# Patient Record
Sex: Female | Born: 1995 | Race: Asian | Hispanic: No | Marital: Single | Smoking: Never smoker
Health system: Southern US, Community
[De-identification: ages and names within clinical notes are randomized; demographics above are authoritative.]

## PROBLEM LIST (undated history)

## (undated) DIAGNOSIS — K649 Unspecified hemorrhoids: Secondary | ICD-10-CM

## (undated) DIAGNOSIS — Z789 Other specified health status: Secondary | ICD-10-CM

## (undated) HISTORY — PX: NO PAST SURGERIES: SHX2092

---

## 2017-07-28 ENCOUNTER — Other Ambulatory Visit (HOSPITAL_COMMUNITY): Payer: Self-pay | Admitting: Nurse Practitioner

## 2017-07-28 DIAGNOSIS — Z3A13 13 weeks gestation of pregnancy: Secondary | ICD-10-CM

## 2017-07-28 DIAGNOSIS — Z369 Encounter for antenatal screening, unspecified: Secondary | ICD-10-CM

## 2017-08-04 ENCOUNTER — Other Ambulatory Visit (HOSPITAL_COMMUNITY): Payer: Self-pay | Admitting: Nurse Practitioner

## 2017-08-04 ENCOUNTER — Encounter (HOSPITAL_COMMUNITY): Payer: Self-pay

## 2017-08-04 DIAGNOSIS — Z369 Encounter for antenatal screening, unspecified: Secondary | ICD-10-CM

## 2017-08-04 DIAGNOSIS — Z3A13 13 weeks gestation of pregnancy: Secondary | ICD-10-CM

## 2017-08-04 DIAGNOSIS — Z3687 Encounter for antenatal screening for uncertain dates: Secondary | ICD-10-CM

## 2017-08-08 ENCOUNTER — Ambulatory Visit (HOSPITAL_COMMUNITY): Admission: RE | Admit: 2017-08-08 | Payer: Medicaid Other | Source: Ambulatory Visit

## 2017-08-08 ENCOUNTER — Other Ambulatory Visit (HOSPITAL_COMMUNITY): Payer: Self-pay | Admitting: Nurse Practitioner

## 2017-08-08 ENCOUNTER — Ambulatory Visit (HOSPITAL_COMMUNITY)
Admission: RE | Admit: 2017-08-08 | Discharge: 2017-08-08 | Disposition: A | Discharge status: Home / Self Care | Payer: Medicaid Other | Source: Ambulatory Visit | Attending: Nurse Practitioner | Admitting: Nurse Practitioner

## 2017-08-08 ENCOUNTER — Encounter (HOSPITAL_COMMUNITY): Payer: Self-pay

## 2017-08-08 DIAGNOSIS — Z3A09 9 weeks gestation of pregnancy: Secondary | ICD-10-CM

## 2017-08-08 DIAGNOSIS — Z3687 Encounter for antenatal screening for uncertain dates: Secondary | ICD-10-CM

## 2017-08-08 DIAGNOSIS — Z3682 Encounter for antenatal screening for nuchal translucency: Secondary | ICD-10-CM | POA: Insufficient documentation

## 2017-08-08 DIAGNOSIS — Z3A13 13 weeks gestation of pregnancy: Secondary | ICD-10-CM

## 2017-08-08 DIAGNOSIS — Z369 Encounter for antenatal screening, unspecified: Secondary | ICD-10-CM

## 2017-08-08 HISTORY — DX: Other specified health status: Z78.9

## 2017-08-09 ENCOUNTER — Other Ambulatory Visit (HOSPITAL_COMMUNITY): Payer: Self-pay | Admitting: *Deleted

## 2017-08-09 DIAGNOSIS — Z3682 Encounter for antenatal screening for nuchal translucency: Secondary | ICD-10-CM

## 2017-08-29 ENCOUNTER — Other Ambulatory Visit (HOSPITAL_COMMUNITY): Payer: Self-pay

## 2017-08-29 ENCOUNTER — Ambulatory Visit (HOSPITAL_COMMUNITY): Payer: Self-pay

## 2017-08-29 ENCOUNTER — Ambulatory Visit (HOSPITAL_COMMUNITY)
Admission: RE | Admit: 2017-08-29 | Discharge: 2017-08-29 | Disposition: A | Discharge status: Home / Self Care | Payer: Self-pay | Source: Ambulatory Visit | Attending: Nurse Practitioner | Admitting: Nurse Practitioner

## 2017-09-04 ENCOUNTER — Other Ambulatory Visit (HOSPITAL_COMMUNITY): Payer: Self-pay | Admitting: *Deleted

## 2017-09-04 ENCOUNTER — Encounter (HOSPITAL_COMMUNITY): Payer: Self-pay

## 2017-09-04 ENCOUNTER — Other Ambulatory Visit (HOSPITAL_COMMUNITY): Payer: Self-pay | Admitting: Obstetrics and Gynecology

## 2017-09-04 ENCOUNTER — Ambulatory Visit (HOSPITAL_COMMUNITY)
Admission: RE | Admit: 2017-09-04 | Discharge: 2017-09-04 | Disposition: A | Discharge status: Home / Self Care | Payer: Medicaid Other | Source: Ambulatory Visit | Attending: Nurse Practitioner | Admitting: Nurse Practitioner

## 2017-09-04 ENCOUNTER — Ambulatory Visit (HOSPITAL_COMMUNITY): Admission: RE | Admit: 2017-09-04 | Payer: Medicaid Other | Source: Ambulatory Visit

## 2017-09-04 DIAGNOSIS — Z3682 Encounter for antenatal screening for nuchal translucency: Secondary | ICD-10-CM

## 2017-09-04 DIAGNOSIS — Z3A13 13 weeks gestation of pregnancy: Secondary | ICD-10-CM | POA: Diagnosis not present

## 2017-09-04 DIAGNOSIS — Z363 Encounter for antenatal screening for malformations: Secondary | ICD-10-CM | POA: Insufficient documentation

## 2017-10-24 ENCOUNTER — Ambulatory Visit (HOSPITAL_COMMUNITY)
Admission: RE | Admit: 2017-10-24 | Discharge: 2017-10-24 | Disposition: A | Discharge status: Home / Self Care | Payer: Medicaid Other | Source: Ambulatory Visit | Attending: Nurse Practitioner | Admitting: Nurse Practitioner

## 2017-10-24 DIAGNOSIS — Z363 Encounter for antenatal screening for malformations: Secondary | ICD-10-CM | POA: Diagnosis present

## 2017-10-24 DIAGNOSIS — Z3A20 20 weeks gestation of pregnancy: Secondary | ICD-10-CM | POA: Insufficient documentation

## 2018-02-21 NOTE — L&D Delivery Note (Signed)
Delivery Note At 1:12 PM a viable female was delivered via Vaginal, Spontaneous (Presentation: OA). APGAR: 9, 9; weight: pending.   Placenta status: delivered intact, spontaneously.  Cord: 3-vessel with the following complications: None.  Cord pH: N/A  Anesthesia: Epidural Episiotomy: None Lacerations: 2nd degree;Perineal Suture Repair: 2.0 3.0 vicryl Est. Blood Loss (mL): 300cc  Mom to postpartum. Baby to Couplet care / Skin to Skin.  Dollene Cleveland 03/01/2018, 1:59 PM   The patient's contractions increased in strength and frequency.  The patient was AROMed at 12:27 PM and baby was at station -1.  Mom felt the urge to push due to increased pain and pressure in her right hip and thigh.  She pushed for approximately 40 minutes and quickly delivered healthy, viable baby boy.  The patient experienced a second-degree perineal laceration with superficial involvement of the skin closely approaching the anus.  This was repaired with 2.0 Vicryl x2.

## 2018-03-01 ENCOUNTER — Inpatient Hospital Stay (HOSPITAL_COMMUNITY)
Admission: AD | Admit: 2018-03-01 | Discharge: 2018-03-03 | DRG: 807 | Disposition: A | Discharge status: Home / Self Care | Payer: Medicaid Other | Attending: Obstetrics and Gynecology | Admitting: Obstetrics and Gynecology

## 2018-03-01 ENCOUNTER — Inpatient Hospital Stay (HOSPITAL_COMMUNITY): Payer: Medicaid Other | Admitting: Anesthesiology

## 2018-03-01 ENCOUNTER — Encounter (HOSPITAL_COMMUNITY): Payer: Self-pay | Admitting: *Deleted

## 2018-03-01 ENCOUNTER — Other Ambulatory Visit: Payer: Self-pay

## 2018-03-01 DIAGNOSIS — Z3A38 38 weeks gestation of pregnancy: Secondary | ICD-10-CM

## 2018-03-01 DIAGNOSIS — Z789 Other specified health status: Secondary | ICD-10-CM

## 2018-03-01 DIAGNOSIS — Z3483 Encounter for supervision of other normal pregnancy, third trimester: Secondary | ICD-10-CM | POA: Diagnosis present

## 2018-03-01 LAB — TYPE AND SCREEN
ABO/RH(D): A POS
ANTIBODY SCREEN: NEGATIVE

## 2018-03-01 LAB — CBC
HEMATOCRIT: 35 % — AB (ref 36.0–46.0)
Hemoglobin: 11.3 g/dL — ABNORMAL LOW (ref 12.0–15.0)
MCH: 24.1 pg — ABNORMAL LOW (ref 26.0–34.0)
MCHC: 32.3 g/dL (ref 30.0–36.0)
MCV: 74.8 fL — ABNORMAL LOW (ref 80.0–100.0)
Platelets: 273 10*3/uL (ref 150–400)
RBC: 4.68 MIL/uL (ref 3.87–5.11)
RDW: 12.9 % (ref 11.5–15.5)
WBC: 12.9 10*3/uL — AB (ref 4.0–10.5)
nRBC: 0 % (ref 0.0–0.2)

## 2018-03-01 LAB — OB RESULTS CONSOLE ABO/RH: RH TYPE: POSITIVE

## 2018-03-01 LAB — OB RESULTS CONSOLE RUBELLA ANTIBODY, IGM: Rubella: IMMUNE

## 2018-03-01 LAB — OB RESULTS CONSOLE GC/CHLAMYDIA
Chlamydia: NEGATIVE
GC PROBE AMP, GENITAL: NEGATIVE

## 2018-03-01 LAB — OB RESULTS CONSOLE RPR: RPR: NONREACTIVE

## 2018-03-01 LAB — OB RESULTS CONSOLE GBS: STREP GROUP B AG: NEGATIVE

## 2018-03-01 LAB — OB RESULTS CONSOLE ANTIBODY SCREEN: Antibody Screen: NEGATIVE

## 2018-03-01 LAB — OB RESULTS CONSOLE HEPATITIS B SURFACE ANTIGEN: Hepatitis B Surface Ag: NEGATIVE

## 2018-03-01 LAB — ABO/RH: ABO/RH(D): A POS

## 2018-03-01 LAB — OB RESULTS CONSOLE HIV ANTIBODY (ROUTINE TESTING): HIV: NONREACTIVE

## 2018-03-01 MED ORDER — EPHEDRINE 5 MG/ML INJ
10.0000 mg | INTRAVENOUS | Status: DC | PRN
  Filled 2018-03-01: qty 2

## 2018-03-01 MED ORDER — LACTATED RINGERS IV SOLN
INTRAVENOUS | Status: DC
  Administered 2018-03-01 (×3): via INTRAVENOUS

## 2018-03-01 MED ORDER — LACTATED RINGERS IV SOLN: 500.0000 mL | Freq: Once | INTRAVENOUS | Status: DC

## 2018-03-01 MED ORDER — ERYTHROMYCIN 5 MG/GM OP OINT
TOPICAL_OINTMENT | OPHTHALMIC | Status: AC
  Filled 2018-03-01: qty 1

## 2018-03-01 MED ORDER — SIMETHICONE 80 MG PO CHEW: 80.0000 mg | CHEWABLE_TABLET | ORAL | Status: DC | PRN

## 2018-03-01 MED ORDER — DIBUCAINE 1 % RE OINT: 1.0000 "application " | TOPICAL_OINTMENT | RECTAL | Status: DC | PRN

## 2018-03-01 MED ORDER — IBUPROFEN 600 MG PO TABS
600.0000 mg | ORAL_TABLET | Freq: Four times a day (QID) | ORAL | Status: DC
  Administered 2018-03-01 – 2018-03-03 (×8): 600 mg via ORAL
  Filled 2018-03-01 (×8): qty 1

## 2018-03-01 MED ORDER — TETANUS-DIPHTH-ACELL PERTUSSIS 5-2.5-18.5 LF-MCG/0.5 IM SUSP: 0.5000 mL | Freq: Once | INTRAMUSCULAR | Status: DC

## 2018-03-01 MED ORDER — OXYCODONE-ACETAMINOPHEN 5-325 MG PO TABS: 2.0000 | ORAL_TABLET | ORAL | Status: DC | PRN

## 2018-03-01 MED ORDER — COCONUT OIL OIL
1.0000 "application " | TOPICAL_OIL | Status: DC | PRN
  Administered 2018-03-02: 1 via TOPICAL
  Filled 2018-03-01: qty 120

## 2018-03-01 MED ORDER — ONDANSETRON HCL 4 MG/2ML IJ SOLN: 4.0000 mg | Freq: Four times a day (QID) | INTRAMUSCULAR | Status: DC | PRN

## 2018-03-01 MED ORDER — ZOLPIDEM TARTRATE 5 MG PO TABS: 5.0000 mg | ORAL_TABLET | Freq: Every evening | ORAL | Status: DC | PRN

## 2018-03-01 MED ORDER — PHENYLEPHRINE 40 MCG/ML (10ML) SYRINGE FOR IV PUSH (FOR BLOOD PRESSURE SUPPORT)
PREFILLED_SYRINGE | INTRAVENOUS | Status: AC
  Filled 2018-03-01: qty 10

## 2018-03-01 MED ORDER — PHENYLEPHRINE 40 MCG/ML (10ML) SYRINGE FOR IV PUSH (FOR BLOOD PRESSURE SUPPORT)
80.0000 ug | PREFILLED_SYRINGE | INTRAVENOUS | Status: DC | PRN
  Filled 2018-03-01: qty 10

## 2018-03-01 MED ORDER — BENZOCAINE-MENTHOL 20-0.5 % EX AERO
1.0000 "application " | INHALATION_SPRAY | CUTANEOUS | Status: DC | PRN
  Administered 2018-03-01: 1 via TOPICAL
  Filled 2018-03-01: qty 56

## 2018-03-01 MED ORDER — ACETAMINOPHEN 325 MG PO TABS: 650.0000 mg | ORAL_TABLET | ORAL | Status: DC | PRN

## 2018-03-01 MED ORDER — FENTANYL 2.5 MCG/ML BUPIVACAINE 1/10 % EPIDURAL INFUSION (WH - ANES)
14.0000 mL/h | INTRAMUSCULAR | Status: DC | PRN
  Administered 2018-03-01: 14 mL/h via EPIDURAL

## 2018-03-01 MED ORDER — FENTANYL 2.5 MCG/ML BUPIVACAINE 1/10 % EPIDURAL INFUSION (WH - ANES)
INTRAMUSCULAR | Status: AC
  Filled 2018-03-01: qty 100

## 2018-03-01 MED ORDER — SENNOSIDES-DOCUSATE SODIUM 8.6-50 MG PO TABS
2.0000 | ORAL_TABLET | ORAL | Status: DC
  Administered 2018-03-02 (×2): 2 via ORAL
  Filled 2018-03-01 (×2): qty 2

## 2018-03-01 MED ORDER — OXYTOCIN BOLUS FROM INFUSION: 500.0000 mL | Freq: Once | INTRAVENOUS | Status: DC

## 2018-03-01 MED ORDER — ONDANSETRON HCL 4 MG PO TABS: 4.0000 mg | ORAL_TABLET | ORAL | Status: DC | PRN

## 2018-03-01 MED ORDER — LIDOCAINE HCL (PF) 1 % IJ SOLN
INTRAMUSCULAR | Status: DC | PRN
  Administered 2018-03-01: 5 mL via EPIDURAL

## 2018-03-01 MED ORDER — OXYCODONE-ACETAMINOPHEN 5-325 MG PO TABS: 1.0000 | ORAL_TABLET | ORAL | Status: DC | PRN

## 2018-03-01 MED ORDER — ONDANSETRON HCL 4 MG/2ML IJ SOLN: 4.0000 mg | INTRAMUSCULAR | Status: DC | PRN

## 2018-03-01 MED ORDER — PRENATAL MULTIVITAMIN CH
1.0000 | ORAL_TABLET | Freq: Every day | ORAL | Status: DC
  Administered 2018-03-02 – 2018-03-03 (×2): 1 via ORAL
  Filled 2018-03-01 (×2): qty 1

## 2018-03-01 MED ORDER — WITCH HAZEL-GLYCERIN EX PADS: 1.0000 "application " | MEDICATED_PAD | CUTANEOUS | Status: DC | PRN

## 2018-03-01 MED ORDER — LACTATED RINGERS IV SOLN: 500.0000 mL | INTRAVENOUS | Status: DC | PRN

## 2018-03-01 MED ORDER — DIPHENHYDRAMINE HCL 50 MG/ML IJ SOLN: 12.5000 mg | INTRAMUSCULAR | Status: DC | PRN

## 2018-03-01 MED ORDER — DIPHENHYDRAMINE HCL 25 MG PO CAPS: 25.0000 mg | ORAL_CAPSULE | Freq: Four times a day (QID) | ORAL | Status: DC | PRN

## 2018-03-01 MED ORDER — OXYTOCIN 40 UNITS IN NORMAL SALINE INFUSION - SIMPLE MED
2.5000 [IU]/h | INTRAVENOUS | Status: DC
  Administered 2018-03-01: 500 [IU]/h via INTRAVENOUS
  Filled 2018-03-01: qty 1000

## 2018-03-01 MED ORDER — SOD CITRATE-CITRIC ACID 500-334 MG/5ML PO SOLN: 30.0000 mL | ORAL | Status: DC | PRN

## 2018-03-01 MED ORDER — LIDOCAINE HCL (PF) 1 % IJ SOLN
30.0000 mL | INTRAMUSCULAR | Status: DC | PRN
  Administered 2018-03-01: 30 mL via SUBCUTANEOUS
  Filled 2018-03-01: qty 30

## 2018-03-01 NOTE — Anesthesia Procedure Notes (Signed)
Epidural Patient location during procedure: OB Start time: 03/01/2018 10:34 AM End time: 03/01/2018 10:40 AM  Staffing Anesthesiologist: Shelton Silvas, MD Performed: anesthesiologist   Preanesthetic Checklist Completed: patient identified, site marked, surgical consent, pre-op evaluation, timeout performed, IV checked, risks and benefits discussed and monitors and equipment checked  Epidural Patient position: sitting Prep: ChloraPrep Patient monitoring: heart rate, continuous pulse ox and blood pressure Approach: midline Location: L3-L4 Injection technique: LOR saline  Needle:  Needle type: Tuohy  Needle gauge: 17 G Needle length: 9 cm Catheter type: closed end flexible Catheter size: 20 Guage Test dose: negative and 1.5% lidocaine  Assessment Events: blood not aspirated, injection not painful, no injection resistance and no paresthesia  Additional Notes LOR @ 5  Patient identified. Risks/Benefits/Options discussed with patient including but not limited to bleeding, infection, nerve damage, paralysis, failed block, incomplete pain control, headache, blood pressure changes, nausea, vomiting, reactions to medications, itching and postpartum back pain. Confirmed with bedside nurse the patient's most recent platelet count. Confirmed with patient that they are not currently taking any anticoagulation, have any bleeding history or any family history of bleeding disorders. Patient expressed understanding and wished to proceed. All questions were answered. Sterile technique was used throughout the entire procedure. Please see nursing notes for vital signs. Test dose was given through epidural catheter and negative prior to continuing to dose epidural or start infusion. Warning signs of high block given to the patient including shortness of breath, tingling/numbness in hands, complete motor block, or any concerning symptoms with instructions to call for help. Patient was given instructions on  fall risk and not to get out of bed. All questions and concerns addressed with instructions to call with any issues or inadequate analgesia.    Reason for block:procedure for pain

## 2018-03-01 NOTE — Progress Notes (Signed)
1020 Interpreter arrived from [employer].

## 2018-03-01 NOTE — H&P (Signed)
Crystal Griffith is a 23 y.o. female presenting for Labor.  [name] live interpretor present in room. Patient came in advance labor, contracting every 2 minutes.   OB History    Gravida  1   Para      Term      Preterm      AB      Living  0     SAB      TAB      Ectopic      Multiple      Live Births  0          Past Medical History:  Diagnosis Date  . Medical history non-contributory    Past Surgical History:  Procedure Laterality Date  . NO PAST SURGERIES     Family History: family history is not on file. Social History:  reports that she has never smoked. She has never used smokeless tobacco. She reports that she does not drink alcohol or use drugs.     Maternal Diabetes: No Genetic Screening: Normal Maternal Ultrasounds/Referrals: Normal Fetal Ultrasounds or other Referrals:  None Maternal Substance Abuse:  No Significant Maternal Medications:  None Significant Maternal Lab Results:  Lab values include: Group B Strep negative, Other:  Other Comments:  HbE trait  ROS History Dilation: 9 Effacement (%): 100 Station: -1 Exam by:: [place], rn Blood pressure 130/64, pulse 76, temperature 98.5 F (36.9 C), temperature source Oral, resp. rate 16, height 5\' 1"  (1.549 m), weight 74.4 kg, last menstrual period 05/25/2017, SpO2 99 %. Exam Physical Exam  Prenatal labs: ABO, Rh: --/--/A POS (01/09 16100914) Antibody: NEG (01/09 0914) Rubella: Immune (01/09 0000) RPR: Nonreactive (01/09 0000)  HBsAg: Negative (01/09 0000)  HIV: Non-reactive (01/09 0000)  GBS: Negative (01/09 0000)   FWB: 135 bpm / mod var / +accels, no decels, REACTIVE CTX: q162min  Assessment/Plan: 23 y.o. G1P0 at 545w3d here for advanced labor.   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   Jen MowElizabeth Talene Glastetter, DO OB Fellow, [facility]  P[facility] Center for [facility], [facility] 03/01/2018, 10:52 AM

## 2018-03-01 NOTE — Anesthesia Pain Management Evaluation Note (Signed)
  CRNA Pain Management Visit Note  Patient: Crystal Griffith, 23 y.o., female  "Hello I am a member of the anesthesia team at [facility]. We have an anesthesia team available at all times to provide care throughout the hospital, including epidural management and anesthesia for C-section. I don't know your plan for the delivery whether it a natural birth, water birth, IV sedation, nitrous supplementation, doula or epidural, but we want to meet your pain goals."   1.Was your pain managed to your expectations on prior hospitalizations?   Yes   2.What is your expectation for pain management during this hospitalization?     Epidural  3.How can we help you reach that goal? epidural  Record the patient's initial score and the patient's pain goal.   Pain: 0  Pain Goal: 4 [facility] wants you to be able to say your pain was always managed very well.  Starling Christofferson 03/01/2018

## 2018-03-01 NOTE — MAU Note (Signed)
Pt reports contractions , some spotting, and decreased fetal movement. Pt denies ROM

## 2018-03-01 NOTE — Anesthesia Preprocedure Evaluation (Signed)
Anesthesia Evaluation  Patient identified by MRN, date of birth, ID band Patient awake    Reviewed: Allergy & Precautions, Patient's Chart, lab work & pertinent test results  Airway Mallampati: II       Dental no notable dental hx.    Pulmonary    Pulmonary exam normal        Cardiovascular negative cardio ROS Normal cardiovascular exam     Neuro/Psych negative neurological ROS     GI/Hepatic Neg liver ROS,   Endo/Other    Renal/GU negative Renal ROS     Musculoskeletal   Abdominal   Peds  Hematology   Anesthesia Other Findings   Reproductive/Obstetrics (+) Pregnancy                             Anesthesia Physical Anesthesia Plan  ASA: II  Anesthesia Plan: Epidural   Post-op Pain Management:    Induction:   PONV Risk Score and Plan:   Airway Management Planned: Natural Airway  Additional Equipment: None  Intra-op Plan:   Post-operative Plan:   Informed Consent: I have reviewed the patients History and Physical, chart, labs and discussed the procedure including the risks, benefits and alternatives for the proposed anesthesia with the patient or authorized representative who has indicated his/her understanding and acceptance.     Plan Discussed with:   Anesthesia Plan Comments: (Interpreter  Lab Results      Component                Value               Date                      WBC                      12.9 (H)            03/01/2018                HGB                      11.3 (L)            03/01/2018                HCT                      35.0 (L)            03/01/2018                MCV                      74.8 (L)            03/01/2018                PLT                      273                 03/01/2018           )        Anesthesia Quick Evaluation

## 2018-03-02 DIAGNOSIS — Z789 Other specified health status: Secondary | ICD-10-CM

## 2018-03-02 LAB — CBC
HCT: 31.7 % — ABNORMAL LOW (ref 36.0–46.0)
Hemoglobin: 10.2 g/dL — ABNORMAL LOW (ref 12.0–15.0)
MCH: 23.9 pg — ABNORMAL LOW (ref 26.0–34.0)
MCHC: 32.2 g/dL (ref 30.0–36.0)
MCV: 74.2 fL — ABNORMAL LOW (ref 80.0–100.0)
PLATELETS: 250 10*3/uL (ref 150–400)
RBC: 4.27 MIL/uL (ref 3.87–5.11)
RDW: 12.9 % (ref 11.5–15.5)
WBC: 16.3 10*3/uL — ABNORMAL HIGH (ref 4.0–10.5)
nRBC: 0 % (ref 0.0–0.2)

## 2018-03-02 LAB — RPR: RPR Ser Ql: NONREACTIVE

## 2018-03-02 NOTE — Anesthesia Postprocedure Evaluation (Signed)
Anesthesia Post Note  Patient: Crystal Griffith  Procedure(s) Performed: AN AD HOC LABOR EPIDURAL     Patient location during evaluation: Mother Baby Anesthesia Type: Epidural Level of consciousness: awake and alert, oriented and patient cooperative Pain management: pain level controlled Vital Signs Assessment: post-procedure vital signs reviewed and stable Respiratory status: spontaneous breathing Cardiovascular status: stable Postop Assessment: no headache, epidural receding, patient able to bend at knees and no signs of nausea or vomiting Anesthetic complications: no Comments: Pain score 2.     Last Vitals:  Vitals:   03/02/18 0015 03/02/18 0525  BP: 114/70 (!) 112/52  Pulse: 76 79  Resp: 18 18  Temp: 37.1 C 37 C  SpO2:      Last Pain:  Vitals:   03/02/18 0525  TempSrc: Oral  PainSc:    Pain Goal:                 [facility]
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
## 2018-03-02 NOTE — Lactation Note (Signed)
This note was copied from a baby's chart. Lactation Consultation Note  Patient Name: Crystal Griffith Date: 03/02/2018 Reason for consult: Initial assessment    [place] interpreter used via phone.  LC offered to assist with bf.  Mom concerned with not having any milk.  Infant undressed and placed STS with mom.  Hand expression taught.  No drops seen.    Mom has wider spaced and slightly tubular breast with short shafted nipples but easily compressible breast tissue.  LC encouraged using breast support with one hand when latching.  LC encourages mom not to nurse in cradle in order to give more neck/head support for infant.  Pillows used to support infant and latch achieved in football hold.  Good rhythmic sucking noted and a swallows were heard with a few compressions.  Infant nursed until falling asleep then bf on the other side until self detaching and falling asleep.    LC reviewed bf basics, bf at least 8-12 times in 24 hours, cueing and feeding on demand, and STS.    Mom desires to bottle feed with formula some in the future as well but told LC she would prefer to only nurse right now due to the successful latching and nursing this feed.  LC reviewed supply and demand and colostrum amounts and stomach size.    Information relayed to RN that mom desires to breast and bottle feed.  Lactation brochure and OP services shared.  Maternal Data Has patient been taught Hand Expression?: Yes Does the patient have breastfeeding experience prior to this delivery?: No  Feeding Feeding Type: Breast Fed  LATCH Score Latch: Repeated attempts needed to sustain latch, nipple held in mouth throughout feeding, stimulation needed to elicit sucking reflex.  Audible Swallowing: A few with stimulation  Type of Nipple: Everted at rest and after stimulation(shorter shaft but compressible)  Comfort (Breast/Nipple): Soft / non-tender  Hold (Positioning): Assistance needed to correctly position  infant at breast and maintain latch.  LATCH Score: 7  Interventions Interventions: Breast feeding basics reviewed;Assisted with latch  Lactation Tools Discussed/Used     Consult Status Consult Status: Follow-up Date: 03/02/18 Follow-up type: In-patient    Maryruth Hancock [facility] 03/02/2018, 12:06 AM

## 2018-03-02 NOTE — Progress Notes (Addendum)
Daily Post Partum Note  03/02/2018 Crystal Griffith is a 23 y.o. G1P1001 PPD#1 s/p  SVD/2nd degree  @ [redacted]w[redacted]d.  Pregnancy c/b nothing 24hr/overnight events:  none  Subjective:  Pt doing well and meeting all PP goals.   Objective:    Current Vital Signs 24h Vital Sign Ranges  T 99 F (37.2 C) Temp  Avg: 98.9 F (37.2 C)  Min: 98.6 F (37 C)  Max: 99.6 F (37.6 C)  BP (!) 110/59 BP  Min: 110/59  Max: 124/66  HR 63 Pulse  Avg: 73.8  Min: 63  Max: 79  RR 16 Resp  Avg: 17.3  Min: 16  Max: 18  SaO2 100 %   SpO2  Avg: 100 %  Min: 100 %  Max: 100 %       24 Hour I/O Current Shift I/O  Time Ins Outs 01/09 0701 - 01/10 0700 In: -  Out: 400 [Urine:100] No intake/output data recorded.    General: NAD Abdomen: soft, nttp. Firm fundus below the umbilicus. Incision Perineum: deferred Skin:  Warm and dry.  Cardiovascular: S1, S2 normal, no murmur, rub or gallop, regular rate and rhythm Respiratory:  Clear to auscultation bilateral. Normal respiratory effort Extremities: no c/c/e  Medications Current Facility-Administered Medications  Medication Dose Route Frequency Provider Last Rate Last Dose  . acetaminophen (TYLENOL) tablet 650 mg  650 mg Oral Q4H PRN Peggyann Shoals C, DO      . benzocaine-Menthol (DERMOPLAST) 20-0.5 % topical spray 1 application  1 application Topical PRN Peggyann Shoals C, DO   1 application at 03/01/18 1630  . coconut oil  1 application Topical PRN Dollene Cleveland, DO      . witch hazel-glycerin (TUCKS) pad 1 application  1 application Topical PRN Dollene Cleveland, DO       And  . dibucaine (NUPERCAINAL) 1 % rectal ointment 1 application  1 application Rectal PRN Dollene Cleveland, DO      . diphenhydrAMINE (BENADRYL) capsule 25 mg  25 mg Oral Q6H PRN Dollene Cleveland, DO      . ibuprofen (ADVIL,MOTRIN) tablet 600 mg  600 mg Oral Q6H Anderson, Hannah C, DO   600 mg at 03/02/18 1230  . ondansetron (ZOFRAN) tablet 4 mg  4 mg Oral Q4H PRN Peggyann Shoals C, DO        Or  . ondansetron [facility]) injection 4 mg  4 mg Intravenous Q4H PRN Dollene Cleveland, DO      . prenatal multivitamin tablet 1 tablet  1 tablet Oral Q1200 Peggyann Shoals C, DO   1 tablet at 03/02/18 1230  . senna-docusate (Senokot-S) tablet 2 tablet  2 tablet Oral Q24H Peggyann Shoals C, DO   2 tablet at 03/02/18 0016  . simethicone (MYLICON) chewable tablet 80 mg  80 mg Oral PRN Dollene Cleveland, DO      . Tdap (BOOSTRIX) injection 0.5 mL  0.5 mL Intramuscular Once Peggyann Shoals C, DO      . zolpidem (AMBIEN) tablet 5 mg  5 mg Oral QHS PRN Dollene Cleveland, DO        Labs:  Recent Labs  Lab 03/01/18 0914 03/02/18 0600  WBC 12.9* 16.3*  HGB 11.3* 10.2*  HCT 35.0* 31.7*  PLT 273 250    Assessment & Plan:  Pt doing well *Postpartum/postop: routine care *Dispo: tomorrow  Interpreter used  A POS / Rubella Immune / Varicella Not immune/  RPR negative / HIV negative /  HepBsAg negative / pap neg 2018 / Breast  / Contraception: patient unsure / Circ: no/ Follow up: [facility]  Cornelia Copa MD Attending Center for [facility])

## 2018-03-03 ENCOUNTER — Encounter (HOSPITAL_COMMUNITY): Payer: Self-pay | Admitting: *Deleted

## 2018-03-03 NOTE — Progress Notes (Signed)
Reviewed d/c instructions with the patient via interpreter # [phone].  Also informed her that baby would remain as a patient and become a baby pt.

## 2018-03-03 NOTE — Lactation Note (Signed)
This note was copied from a baby's chart. Lactation Consultation Note  Patient Name: Crystal Griffith Date: 03/03/2018 Reason for consult: Follow-up assessment  [name] interpreter [phone] used. P1, Baby 44 hours old. Baby cueing. Encouraged mother to unwrap baby for feeding. Mother latched baby in cradle hold.  Turned infant's body toward mother. Intermittent sucks and swallows observed. Feed on demand approximately 8-12 times per day.   Reviewed engorgement care and monitoring voids/stools. Mother had manual pump.  Denies questions or concerns.   Maternal Data    Feeding Feeding Type: Breast Fed  LATCH Score Latch: Grasps breast easily, tongue down, lips flanged, rhythmical sucking.  Audible Swallowing: A few with stimulation  Type of Nipple: Everted at rest and after stimulation  Comfort (Breast/Nipple): Soft / non-tender  Hold (Positioning): No assistance needed to correctly position infant at breast.  LATCH Score: 9  Interventions Interventions: Breast feeding basics reviewed;Hand pump  Lactation Tools Discussed/Used     Consult Status Consult Status: Complete Date: 03/03/18    Dahlia Byes [facility] 03/03/2018, 10:02 AM

## 2018-03-03 NOTE — Progress Notes (Signed)
CSW spoke with MOB's bedside nurse and confirmed that MOB's misinterpreted question #10 on the Edinburgh assessment. MOB has had thoughts in the past but none current or within the last 7 days.    MOB also has all essential  Items for infant as observed by CSW (car seat) and bedside nurse (baby box).  CSW received consult for hx of Anxiety and Depression.  CSW met with MOB to offer support and complete assessment.    CSW identifies no further need for intervention and no barriers to discharge at this time.  Laurey Arrow, MSW, LCSW Clinical Social Work [phone]

## 2018-03-03 NOTE — Discharge Summary (Addendum)
OB Discharge Summary     Patient Name: Crystal Griffith DOB: 1996/02/02 MRN: [id number]  Date of admission: 03/01/2018 Delivering MD: Peggyann Shoals C   Date of discharge: 03/03/2018  Admitting diagnosis: 38WKS CONTRACTIONS Intrauterine pregnancy: [redacted]w[redacted]d     Secondary diagnosis:  Active Problems:   Normal delivery at term   Language barrier  Additional problems: None     Discharge diagnosis: Term Pregnancy Delivered                                                                                                Post partum procedures:2nd Lac repair with suli  Augmentation: Pitocin  Complications: None  Hospital course:  Onset of Labor With Vaginal Delivery     23 y.o. yo G1P0 at [redacted]w[redacted]d was admitted in Latent Labor on 03/01/2018. Patient had an uncomplicated labor course as follows:  Membrane Rupture Time/Date: 12:27 PM ,03/01/2018   Intrapartum Procedures: Episiotomy: None [1]                                         Lacerations:  2nd degree [3];Perineal [11]  Patient had a delivery of a Viable infant. 03/01/2018  Information for the patient's newborn:  Tiare, Deegan [[id number]]  Delivery Method: Vag-Spont    Pateint had an uncomplicated postpartum course.  She is ambulating, tolerating a regular diet, passing flatus, and urinating well. Patient is discharged home in stable condition on 03/03/18.   Physical exam  Vitals:   03/02/18 1523 03/02/18 1603 03/02/18 2326 03/03/18 0611  BP: (!) 125/54  111/72 (!) 92/50  Pulse: 61  70 (!) 54  Resp: 18  12 16   Temp: 98.6 F (37 C) 98.4 F (36.9 C) 98.3 F (36.8 C) 98.4 F (36.9 C)  TempSrc: Oral Oral Oral Oral  SpO2:   100%   Weight:      Height:       General: alert, cooperative and no distress Lochia: appropriate Uterine Fundus: firm Incision: Healing well with no significant drainage DVT Evaluation: No evidence of DVT seen on physical exam. Negative Homan's sign. No cords or calf tenderness. No significant calf/ankle  edema. Labs: Lab Results  Component Value Date   WBC 16.3 (H) 03/02/2018   HGB 10.2 (L) 03/02/2018   HCT 31.7 (L) 03/02/2018   MCV 74.2 (L) 03/02/2018   PLT 250 03/02/2018   No flowsheet data found.  Discharge instruction: per After Visit Summary and "Baby and Me Booklet".  After visit meds:  Allergies as of 03/03/2018   No Known Allergies     Medication List    TAKE these medications   PRENATAL VITAMIN PO Take 1 tablet by mouth daily.       Diet: routine diet  Activity: Advance as tolerated. Pelvic rest for 6 weeks.   Outpatient follow up:4 weeks Follow up Appt:No future appointments. Follow up Visit:No follow-ups on file.  Postpartum contraception: Undecided  Newborn Data: Live born female  Birth Weight: 7 lb 1.8 oz (3225 g)  APGAR: 9,   Newborn Delivery   Birth date/time:  03/01/2018 13:12:00 Delivery type:  Vaginal, Spontaneous     Baby Feeding: Breast Disposition:home with mother   03/03/2018 Dollene Cleveland, DO   OB FELLOW DISCHARGE ATTESTATION  I have seen and examined this patient and agree with above documentation in the resident's note.   Marcy Siren, D.O. OB Fellow  03/05/2018, 2:36 PM

## 2018-03-04 ENCOUNTER — Ambulatory Visit: Payer: Self-pay

## 2018-03-04 NOTE — Lactation Note (Signed)
This note was copied from a baby's chart. Lactation Consultation Note  Patient Name: Crystal Griffith [id number] Date: 03/04/2018 Reason for consult: Follow-up assessment;Early term 37-38.6wks;Primapara;1st time breastfeeding  P1 mother whose infant is now 38+3 weeks.  Baby was laying next to mother in the side lying position when I arrived.  He was sleeping.  Mother stated that her nipples were bleeding and painful but she felt fine breast feeding baby.  We discussed latching and proper technique to obtain a deep latch.  Upon assessment, mother's breasts were soft and non tender and nipples were intact.  Her left nipple was slightly reddened but right nipple looked intact without breakdown.  Suggested she use EBM to rub into nipples/areolas and then I also provided comfort gels with instructions for use.  Applied them and mother felt immediate relief.  Engorgement prevention/treatment reviewed.  She will continue to feed 8-12 times/24 hours or sooner if baby shows feeding cues.  Stressed the importance of a wide gape prior to latching and to obtain a deep latch into the breast tissue.  I offered to assist if mother wanted to stay for another feeding prior to discharge.  However, mother decided that she would rather be discharged and practice at home.  She has our OP phone number for questions/concerns.  Father present.     Maternal Data Formula Feeding for Exclusion: No Has patient been taught Hand Expression?: Yes Does the patient have breastfeeding experience prior to this delivery?: No  Feeding    LATCH Score                   Interventions    Lactation Tools Discussed/Used WIC Program: Yes   Consult Status Consult Status: Complete Date: 03/04/18 Follow-up type: Call as needed    Britanny Marksberry [place] 03/04/2018, 12:05 PM

## 2018-07-28 IMAGING — US US MFM OB COMPLETE +14 WKS
1 series · 15 of 27 positions shown · non-contrast
Comparison: none

[Series 1: us mfm ob complete +14 wks · 15 of 27 slices shown]
[im 1/27]
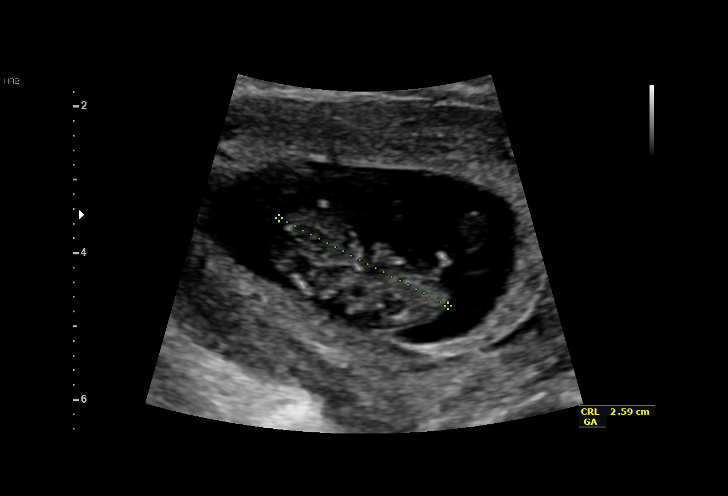
[im 3/27]
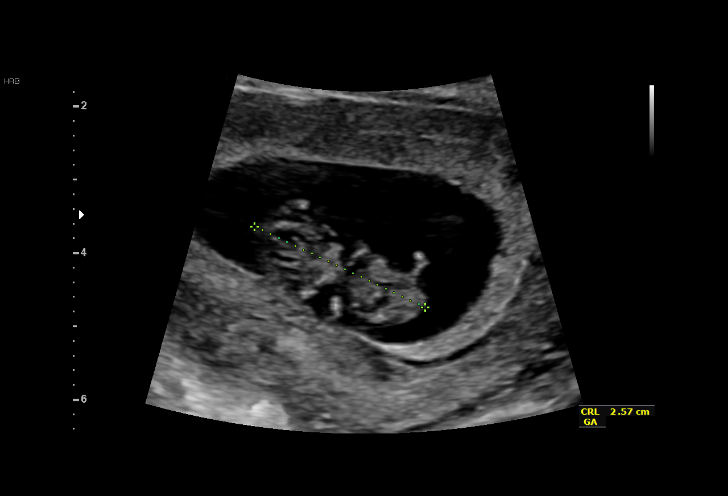
[im 5/27]
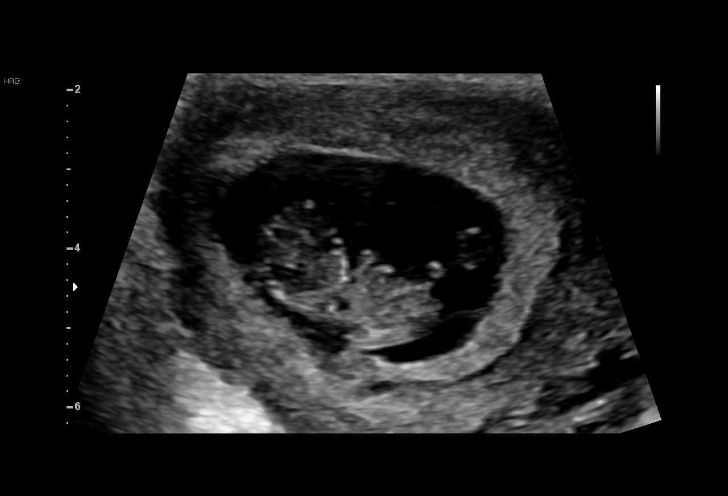
[im 7/27]
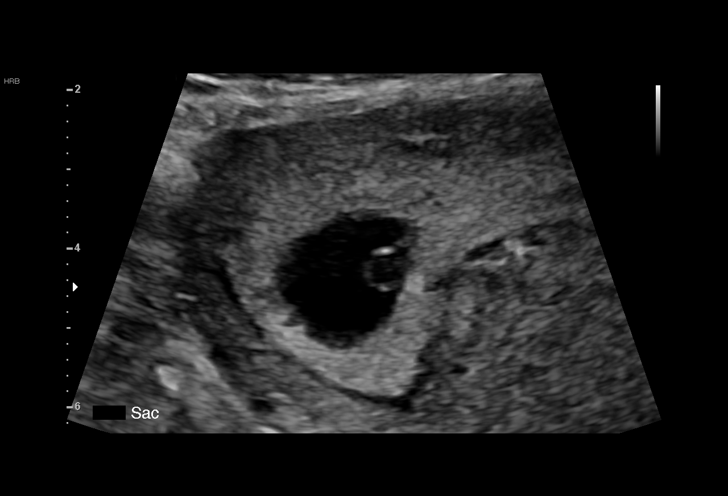
[im 9/27]
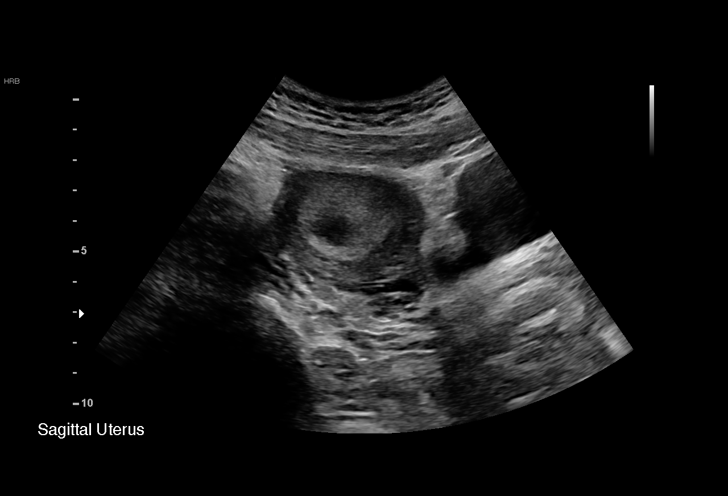
[im 10/27]
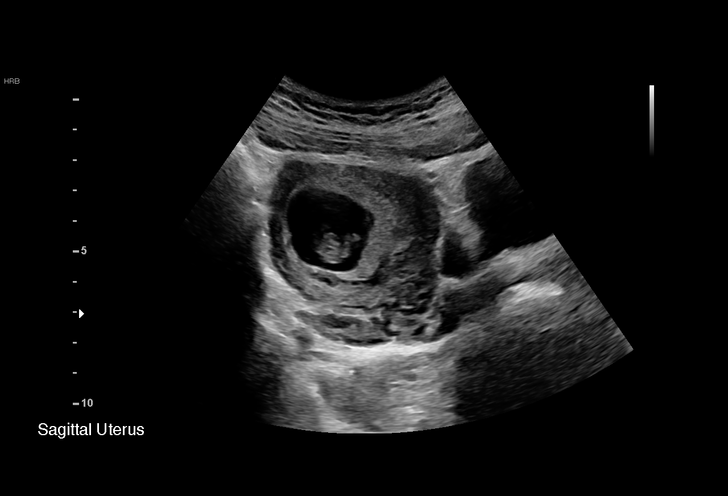
[im 12/27]
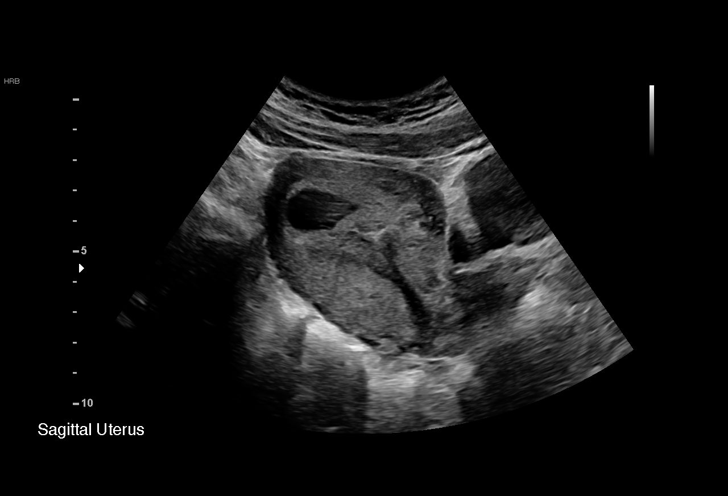
[im 14/27]
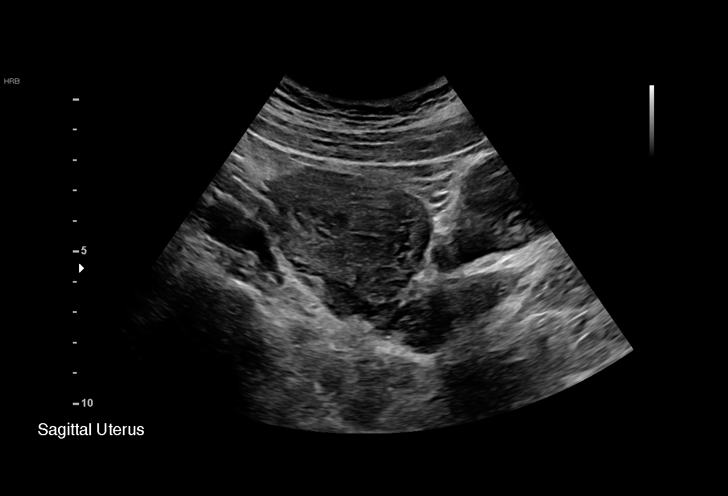
[im 16/27]
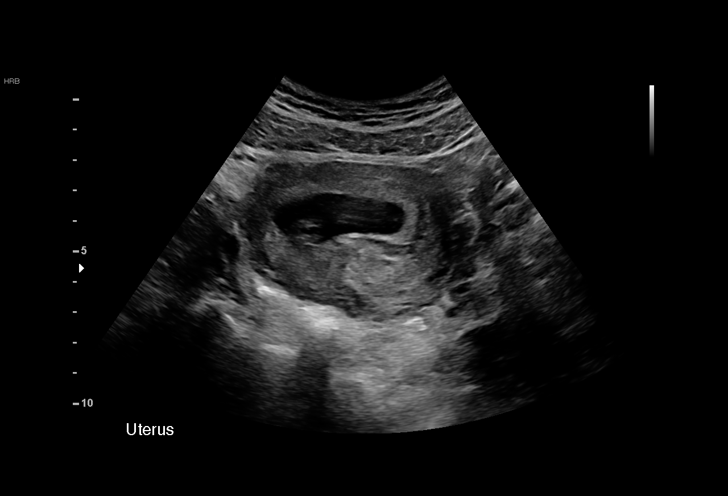
[im 18/27]
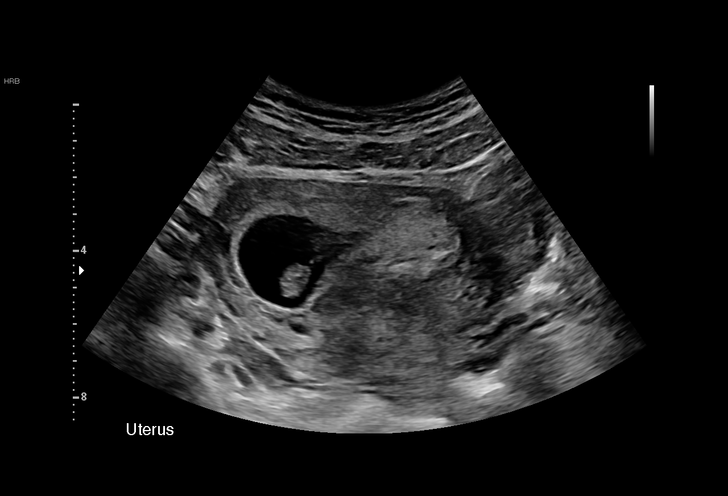
[im 19/27]
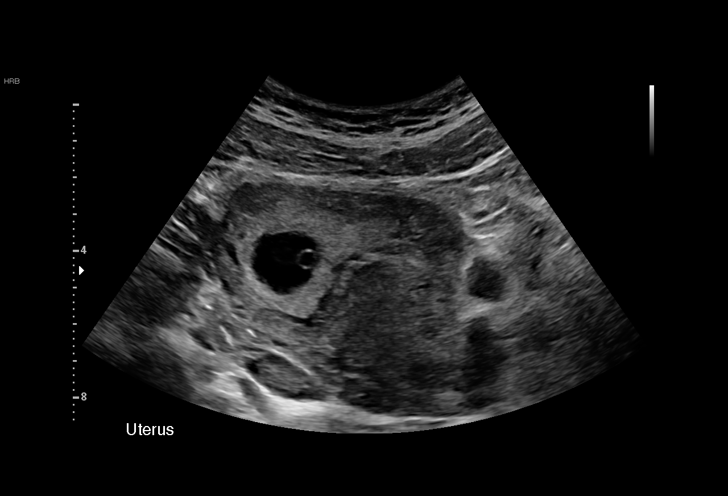
[im 21/27]
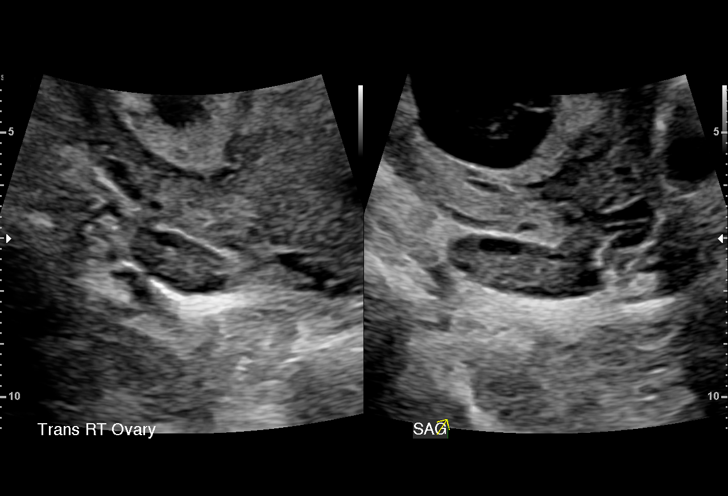
[im 23/27]
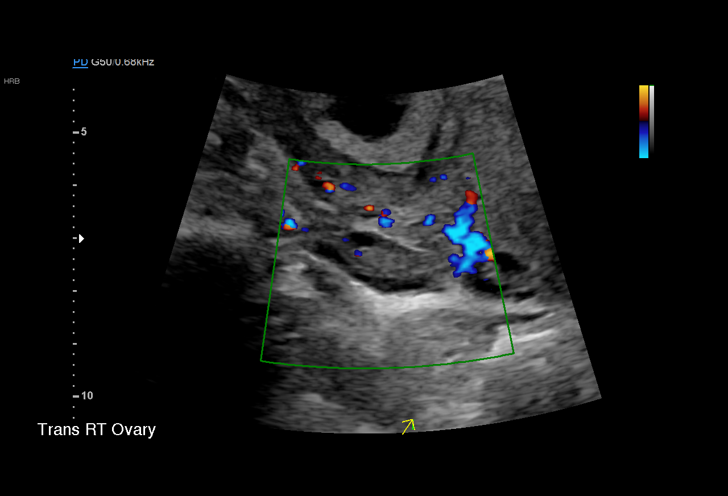
[im 25/27]
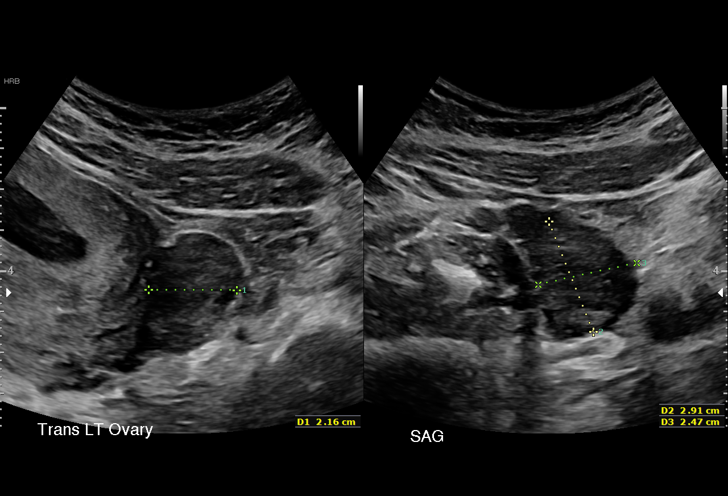
[im 27/27]
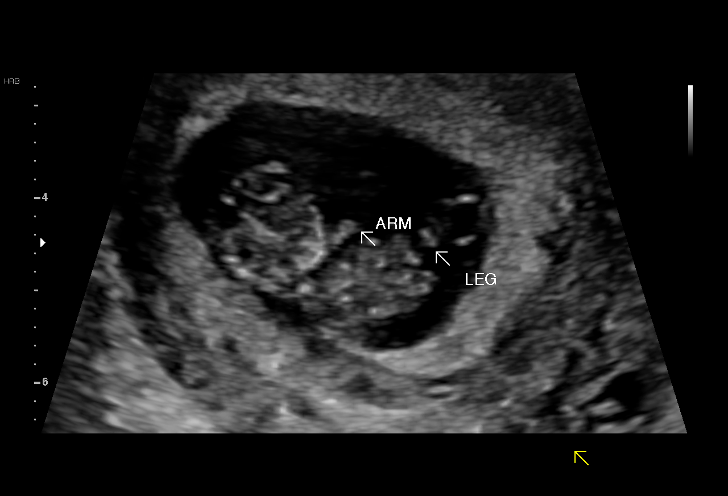

[15 of 27 positions shown; findings below may reference images not displayed]

Health
KABITA NP

WEEKS

1  BLAIN JUMPER             [id number]      [id number]     [id number]
Indications

9 weeks gestation of pregnancy
Encounter for nuchal translucency
OB History

Blood Type:            Height:  5'1"   Weight (lb):  141       BMI:
Gravidity:    1
Fetal Evaluation

Num Of Fetuses:     1
Preg. Location:     Intrauterine
Gest. Sac:          Intrauterine
Yolk Sac:           Visualized
Fetal Pole:         Visualized
Fetal Heart         187
Rate(bpm):
Cardiac Activity:   Observed

Amniotic Fluid
AFI FV:      Subjectively within normal limits
Biometry

CRL:      25.5  mm     G. Age:  9w 1d                   EDD:   03/12/18
Gestational Age

LMP:           13w 5d        Date:  05/04/17                 EDD:   02/08/18
Best:          9w 1d      Det. By:  U/S C R L (08/08/17)     EDD:   03/12/18
Cervix Uterus Adnexa
Cervix
Normal appearance by transabdominal scan.

Uterus
No abnormality visualized.

Left Ovary
Size(cm)     2.91   x   2.47   x  2.16      Vol(ml):
Within normal limits. Small corpus luteum noted.

Right Ovary
Size(cm)     3.07   x   1.01   x  1.8       Vol(ml):
Within normal limits.

Cul De Sac:   No free fluid seen.

Adnexa:       No abnormality visualized.
Impression

IUP at 13+5 weeks here for first trimester screening
Normal fetal cardiac activity
Normal fetal morphology; nasal bone not visualized
CRL indicates EGA of 9+1 weeks
Nuchal translucency not measured today
Recommendations

Recommend using EDC of 03/12/2018
Repeat attempt at NT scan in 3 weeks

## 2018-10-13 IMAGING — US US MFM OB COMP +14 WKS
1 series · 14 of 28 positions shown · non-contrast
Comparison: none

[Series 1: us mfm ob comp +14 wks · 14 of 128 slices shown]
[im 5/128]
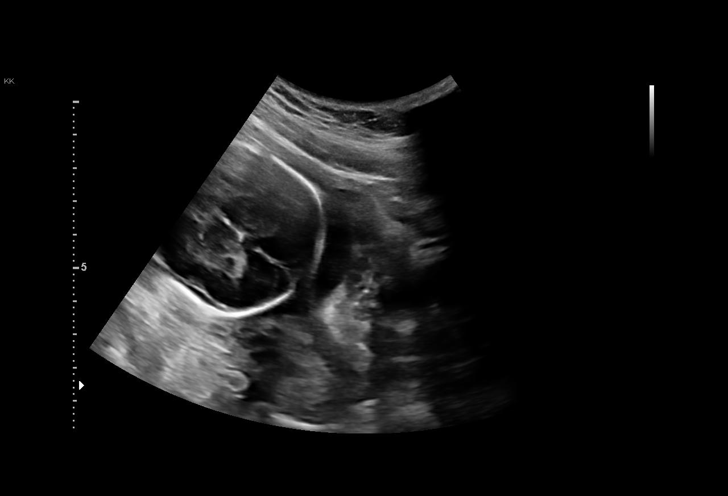
[im 15/128]
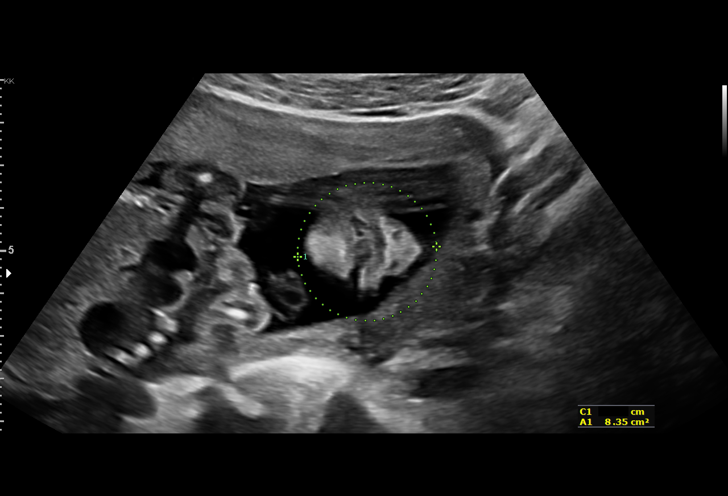
[im 24/128]
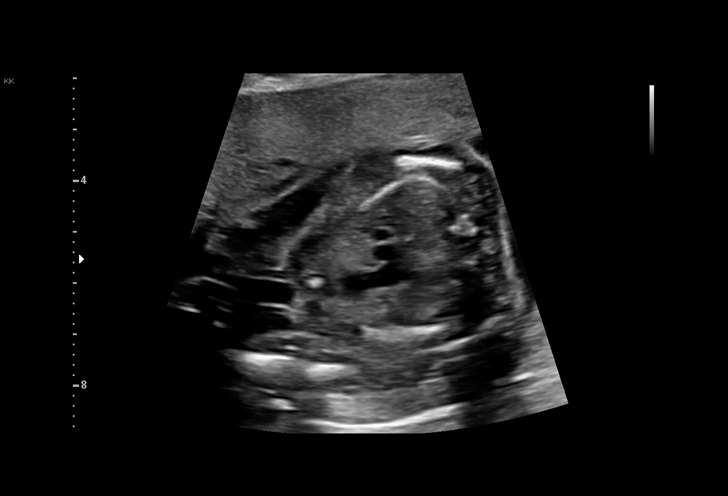
[im 33/128]
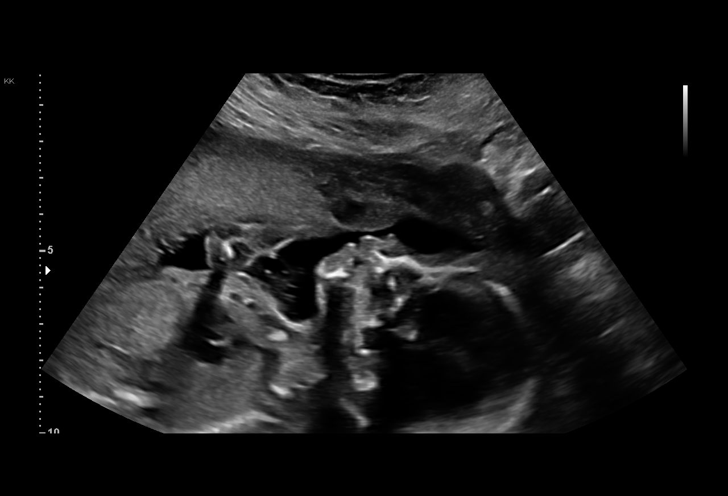
[im 43/128]
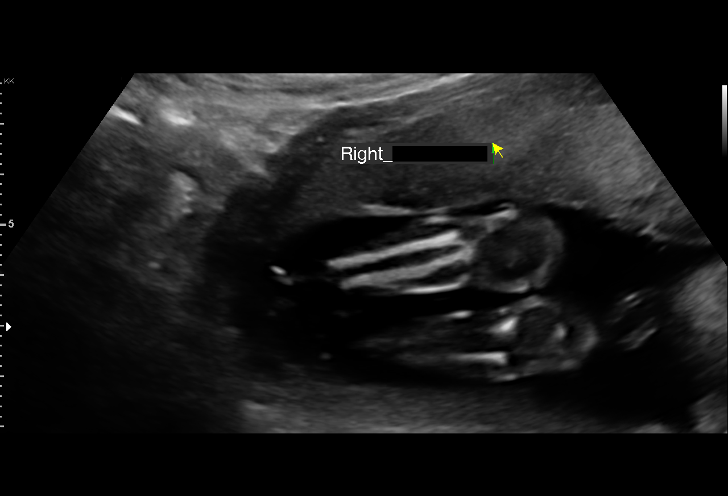
[im 52/128]
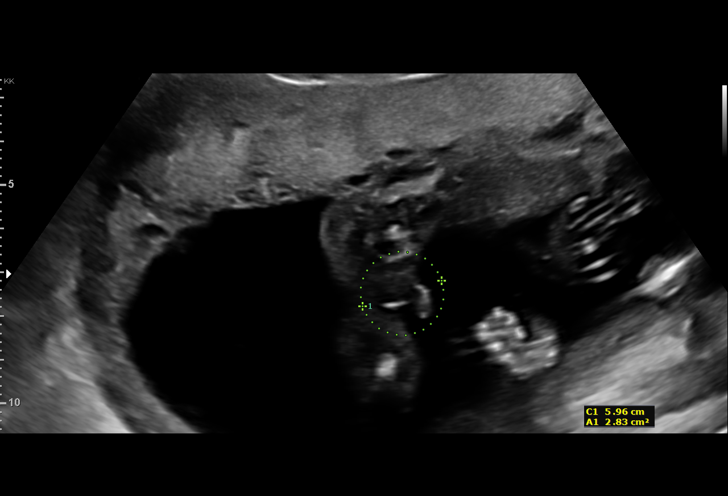
[im 62/128]
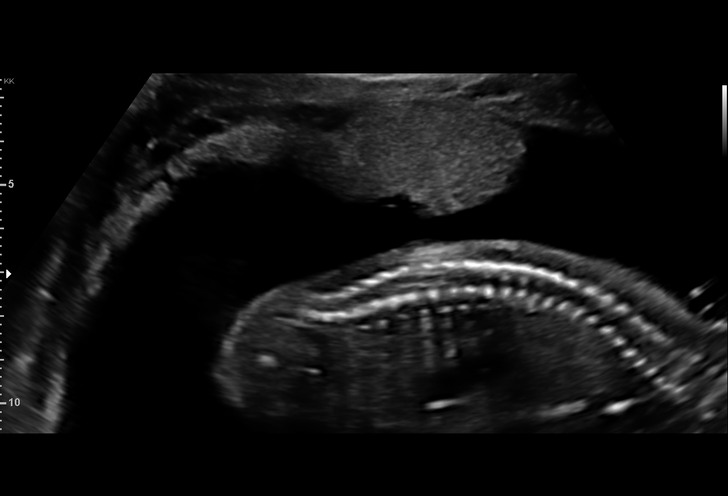
[im 71/128]
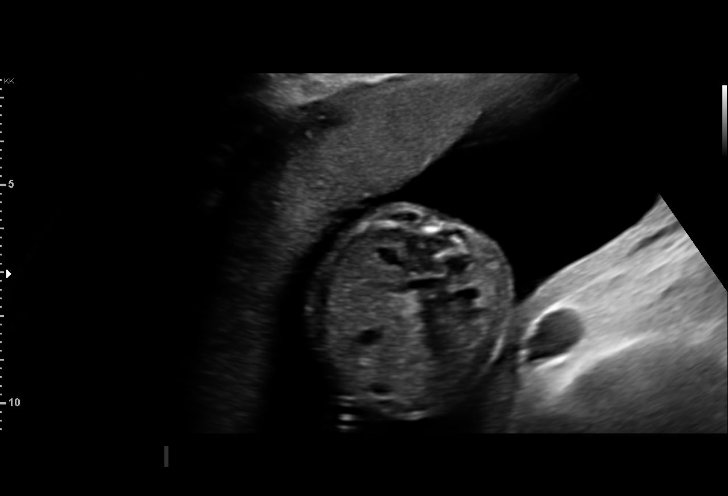
[im 80/128]
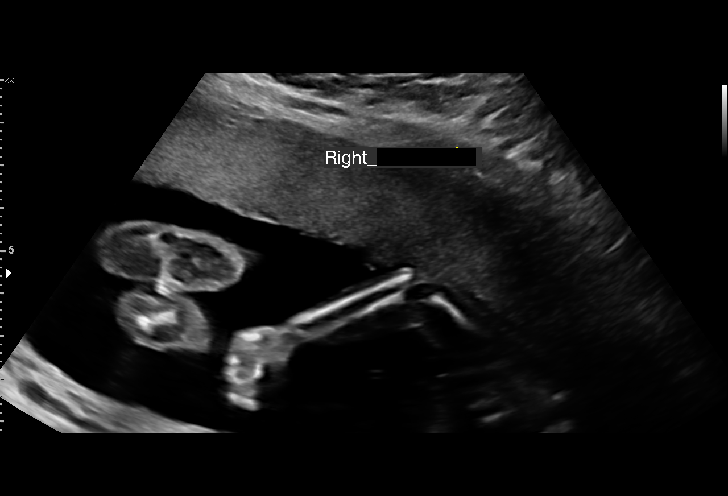
[im 90/128]
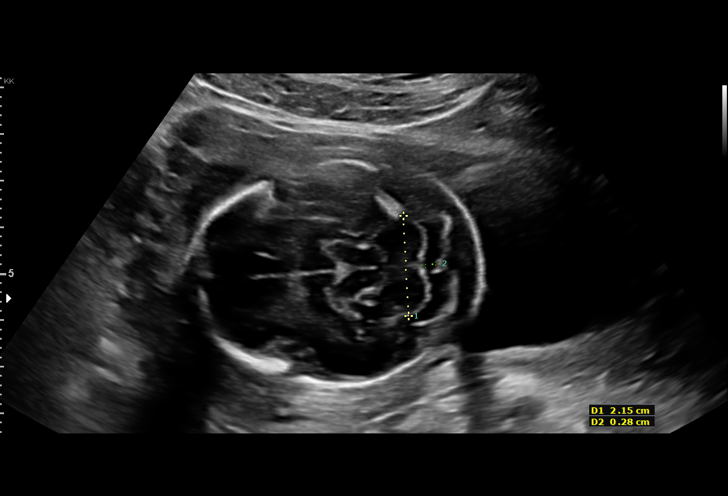
[im 99/128]
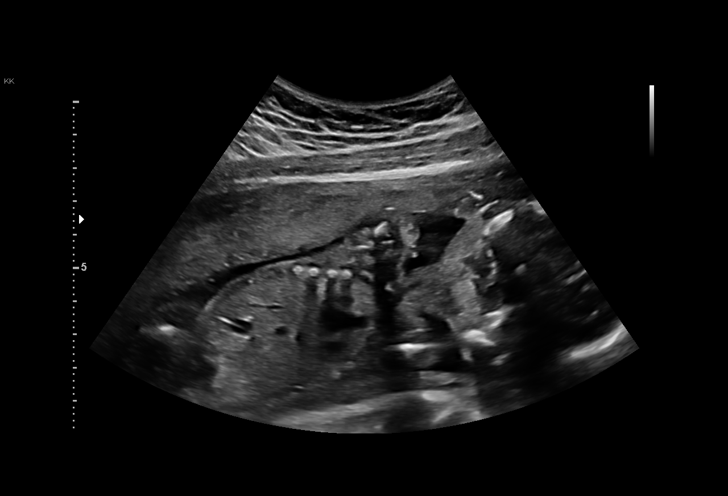
[im 109/128]
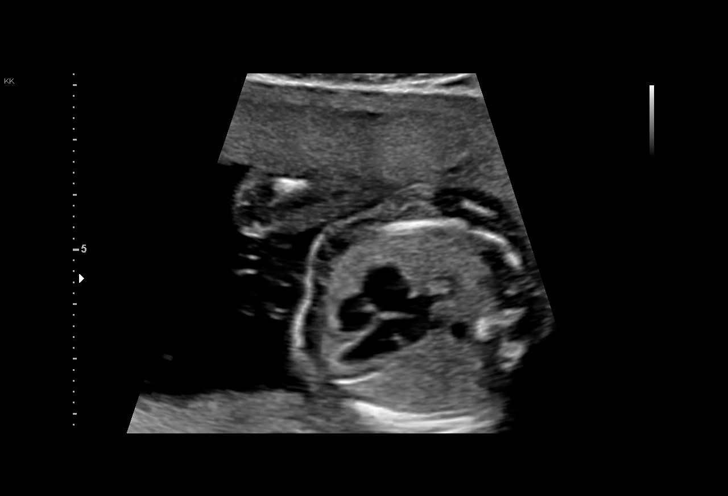
[im 118/128]
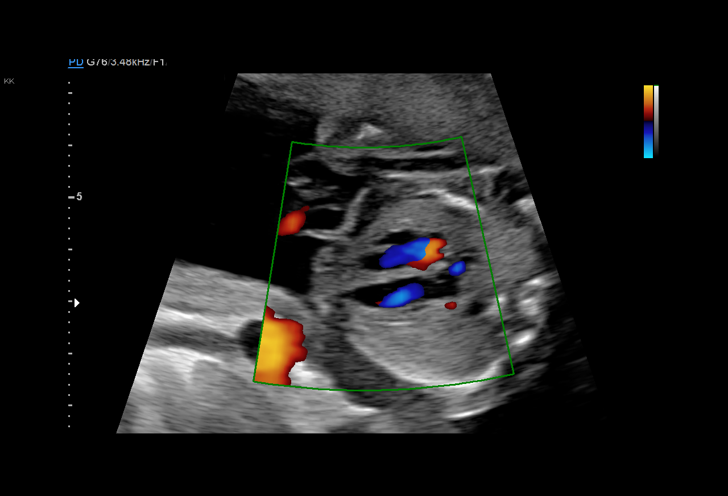
[im 128/128]
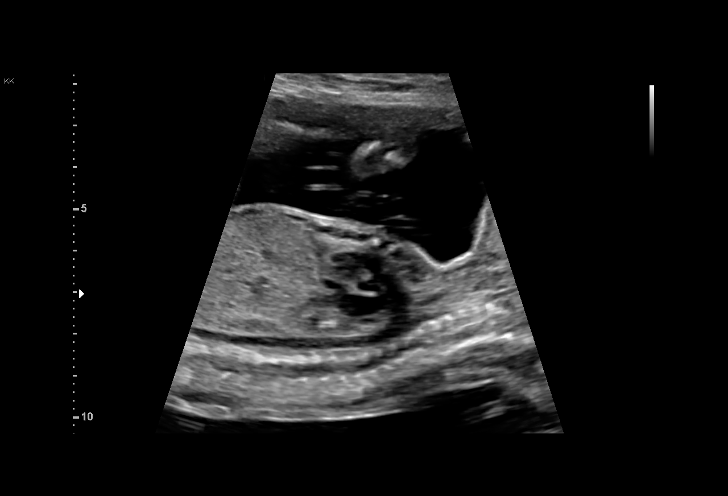

[14 of 28 positions shown; findings below may reference images not displayed]

Health
DELOWR NP

Indications

20 weeks gestation of pregnancy
Encounter for antenatal screening for
malformations
Vital Signs

BMI:
Fetal Evaluation

Num Of Fetuses:         1
Fetal Heart Rate(bpm):  155
Cardiac Activity:       Observed
Presentation:           Cephalic
Placenta:               Anterior
P. Cord Insertion:      Visualized

Amniotic Fluid
AFI FV:      Within normal limits

Largest Pocket(cm)
4.6
Biometry

BPD:        50  mm     G. Age:  21w 1d         86  %    CI:        75.05   %    70 - 86
FL/HC:      18.4   %    16.8 -
HC:      183.1  mm     G. Age:  20w 5d         67  %    HC/AC:      1.11        1.09 -
AC:      164.3  mm     G. Age:  21w 4d         83  %    FL/BPD:     67.2   %
FL:       33.6  mm     G. Age:  20w 4d         56  %    FL/AC:      20.5   %    20 - 24

Est. FW:     394  gm    0 lb 14 oz      60  %
OB History

Gravidity:    1
Gestational Age

LMP:           24w 5d        Date:  05/04/17                 EDD:   02/08/18
U/S Today:     21w 0d                                        EDD:   03/06/18
Best:          20w 1d     Det. By:  U/S C R L  (08/08/17)    EDD:   03/12/18
Anatomy

Cranium:               Appears normal         LVOT:                   Appears normal
Cavum:                 Appears normal         Aortic Arch:            Not well visualized
Ventricles:            Appears normal         Ductal Arch:            Appears normal
Choroid Plexus:        Appears normal         Diaphragm:              Appears normal
Cerebellum:            Appears normal         Stomach:                Appears normal, left
sided
Posterior Fossa:       Appears normal         Abdomen:                Appears normal
Nuchal Fold:           Not applicable (>20    Abdominal Wall:         Appears nml (cord
wks GA)                                        insert, abd wall)
Face:                  Appears normal         Cord Vessels:           Appears normal (3
(orbits and profile)                           vessel cord)
Lips:                  Appears normal         Kidneys:                Appear normal
Palate:                Appears normal         Bladder:                Appears normal
Thoracic:              Appears normal         Spine:                  Appears normal
Heart:                 Appears normal         Upper Extremities:      Appears normal
(4CH, axis, and situs
RVOT:                  Appears normal         Lower Extremities:      Appears normal

Other:  Male gender. Heels and 5th digit visualized. Technically difficult due
to fetal position.
Cervix Uterus Adnexa

Cervix
Length:            3.1  cm.
Normal appearance by transabdominal scan.
Impression

We performed fetal anatomy scan. No makers of
aneuploidies or fetal structural defects are seen. Fetal
biometry is consistent with her previously-established dates.
Amniotic fluid is normal and good fetal activity is seen.
Patient has not had serum screening for fetal aneuploidies.
Recommendations

Follow-up scans as clinically indicated.

## 2022-02-02 ENCOUNTER — Encounter (HOSPITAL_COMMUNITY): Payer: Self-pay

## 2022-02-02 ENCOUNTER — Ambulatory Visit (HOSPITAL_COMMUNITY)
Admission: EM | Admit: 2022-02-02 | Discharge: 2022-02-02 | Disposition: A | Discharge status: Home / Self Care | Payer: Medicaid Other | Attending: Internal Medicine | Admitting: Internal Medicine

## 2022-02-02 DIAGNOSIS — R059 Cough, unspecified: Secondary | ICD-10-CM | POA: Insufficient documentation

## 2022-02-02 DIAGNOSIS — J069 Acute upper respiratory infection, unspecified: Secondary | ICD-10-CM | POA: Diagnosis not present

## 2022-02-02 DIAGNOSIS — Z1152 Encounter for screening for COVID-19: Secondary | ICD-10-CM | POA: Diagnosis not present

## 2022-02-02 LAB — POC INFLUENZA A AND B ANTIGEN (URGENT CARE ONLY)
INFLUENZA A ANTIGEN, POC: NEGATIVE
INFLUENZA B ANTIGEN, POC: NEGATIVE

## 2022-02-02 NOTE — ED Triage Notes (Signed)
Pt is here for cough, headache, sore throat, neck pain, back pain, body aches, chills x 1wk

## 2022-02-02 NOTE — ED Provider Notes (Signed)
[facility]   [id number] 02/02/22 Arrival Time: 0830  ASSESSMENT & PLAN:  1. Viral upper respiratory tract infection    -History exam is consistent with viral URI.  I suspect COVID.  She was tested for COVID today.  Her point-of-care flu test was negative today.  Will call her if COVID test positive.  Advised symptomatic treatment with over-the-counter medicine such as Tylenol/ibuprofen for aches, Mucinex/Robitussin for cough.  Tea with honey for cough as well.  All questions were answered she agrees to plan.  No orders of the defined types were placed in this encounter.    Discharge Instructions      Your symptoms today are most likely being caused by a virus and should steadily improve in time it can take up to 7 to 10 days before you truly start to see a turnaround however things will get better    You can take Tylenol and/or Ibuprofen as needed for fever reduction and pain relief.   For cough: honey 1/2 to 1 teaspoon (you can dilute the honey in water or another fluid).  You can also use guaifenesin and dextromethorphan for cough. You can use a humidifier for chest congestion and cough.  If you don't have a humidifier, you can sit in the bathroom with the hot shower running.      For sore throat: try warm salt water gargles, cepacol lozenges, throat spray, warm tea or water with lemon/honey, popsicles or ice, or OTC cold relief medicine for throat discomfort.   For congestion: take a daily anti-histamine like Zyrtec, Claritin, and a oral decongestant, such as pseudoephedrine.  You can also use Flonase 1-2 sprays in each nostril daily.   It is important to stay hydrated: drink plenty of fluids (water, gatorade/powerade/pedialyte, juices, or teas) to keep your throat moisturized and help further relieve irritation/discomfort.         Reviewed expectations re: course of current medical issues. Questions answered. Outlined signs and symptoms indicating need for more  acute intervention. Patient verbalized understanding. After Visit Summary given.   SUBJECTIVE:  Crystal Griffith 26 year old female comes urgent care to be evaluated for cough, headache, sore throat, body aches.  Symptoms have been present for about 1 week.  She reports a cough that is sometimes productive with yellow sputum.  Her throat feels "scratchy, like I need to drink water".  She also reports some associated generalized aches, mostly in the neck and back.  She has been trying over-the-counter day and nighttime cough medicine.  She reports some subjective fevers and chills but did not take her temperature.  She denies any chest pain, shortness of breath, abdominal pain, nausea or vomiting.  No sick contacts at home or at work.  Did not get her flu shot this year.  Did get COVID shots.   Patient's last menstrual period was 01/26/2022. Past Surgical History:  Procedure Laterality Date   NO PAST SURGERIES       OBJECTIVE:  Vitals:   02/02/22 0952  BP: 118/76  Pulse: 91  Resp: 12  Temp: 98.4 F (36.9 C)  TempSrc: Oral  SpO2: 98%     Physical Exam Vitals and nursing note reviewed.  Constitutional:      Appearance: She is not ill-appearing.  HENT:     Head: Normocephalic.     Right Ear: Tympanic membrane normal.     Left Ear: Tympanic membrane normal.     Nose: Congestion present.     Mouth/Throat:     Pharynx:  Posterior oropharyngeal erythema present.  Cardiovascular:     Rate and Rhythm: Normal rate and regular rhythm.  Pulmonary:     Effort: Pulmonary effort is normal.     Breath sounds: Normal breath sounds. No wheezing.  Abdominal:     General: Abdomen is flat.     Palpations: Abdomen is soft.  Musculoskeletal:        General: No tenderness. Normal range of motion.     Cervical back: Normal range of motion.  Lymphadenopathy:     Cervical: Cervical adenopathy present.  Neurological:     General: No focal deficit present.     Mental Status: She is alert.   Psychiatric:        Mood and Affect: Mood normal.      Labs: Results for orders placed or performed during the hospital encounter of 02/02/22  POC Influenza A & B Ag (Urgent Care)  Result Value Ref Range   INFLUENZA A ANTIGEN, POC NEGATIVE NEGATIVE   INFLUENZA B ANTIGEN, POC NEGATIVE NEGATIVE   Labs Reviewed  SARS CORONAVIRUS 2 (TAT 6-24 HRS)  POC INFLUENZA A AND B ANTIGEN (URGENT CARE ONLY)    Imaging: No results found.   No Known Allergies                                             Past Medical History:  Diagnosis Date   Medical history non-contributory     Social History   Socioeconomic History   Marital status: Single    Spouse name: Not on file   Number of children: Not on file   Years of education: Not on file   Highest education level: Not on file  Occupational History   Not on file  Tobacco Use   Smoking status: Never   Smokeless tobacco: Never  Vaping Use   Vaping Use: Never used  Substance and Sexual Activity   Alcohol use: Never   Drug use: Never   Sexual activity: Yes    Birth control/protection: None  Other Topics Concern   Not on file  Social History Narrative   Not on file   Social Determinants of Health   Financial Resource Strain: Not on file  Food Insecurity: Not on file  Transportation Needs: Not on file  Physical Activity: Not on file  Stress: Not on file  Social Connections: Not on file  Intimate Partner Violence: Not on file    History reviewed. No pertinent family history.    Tanja Gift, Baldemar Friday, MD 02/02/22 1059

## 2022-02-02 NOTE — Discharge Instructions (Addendum)

## 2022-02-03 LAB — SARS CORONAVIRUS 2 (TAT 6-24 HRS): SARS Coronavirus 2: NEGATIVE

## 2022-03-15 ENCOUNTER — Ambulatory Visit (HOSPITAL_COMMUNITY)
Admission: EM | Admit: 2022-03-15 | Discharge: 2022-03-15 | Disposition: A | Discharge status: Home / Self Care | Payer: Medicaid Other | Attending: Emergency Medicine | Admitting: Emergency Medicine

## 2022-03-15 ENCOUNTER — Encounter (HOSPITAL_COMMUNITY): Payer: Self-pay

## 2022-03-15 DIAGNOSIS — K649 Unspecified hemorrhoids: Secondary | ICD-10-CM | POA: Diagnosis not present

## 2022-03-15 DIAGNOSIS — K6289 Other specified diseases of anus and rectum: Secondary | ICD-10-CM | POA: Diagnosis not present

## 2022-03-15 HISTORY — DX: Unspecified hemorrhoids: K64.9

## 2022-03-15 MED ORDER — HYDROCORTISONE 1 % EX CREA: TOPICAL_CREAM | CUTANEOUS | 0 refills | Status: DC

## 2022-03-15 MED ORDER — POLYETHYLENE GLYCOL 4500 POWD: 17.0000 g | Freq: Every day | 0 refills | Status: DC

## 2022-03-15 NOTE — ED Triage Notes (Signed)
Patient states she is here for hemorrhoids and has had x 1 year. Patient c/o increased pain and no bleeding at this time.  Patient states she has not used any OTC medications.

## 2022-03-15 NOTE — ED Provider Notes (Signed)
[facility]    CSN: [id number] Arrival date & time: 03/15/22  0347      History   Chief Complaint Chief Complaint  Patient presents with   Hemorrhoids    HPI Crystal Griffith is a 27 y.o. female.  Medical interpretor used for this encounter- after several attempts to contact video interpretor, called one via phone  1 year history of hemorrhoid Reports on and off pain with BM, sometimes bleeding. Pain worse recently but no bleeding. She has hard stool with pretty much every BM No vomiting or abd pain.  Has not tried any medications. No stool softeners  She does not have a PCP. Reports she has never seen one  Past Medical History:  Diagnosis Date   Hemorrhoid    Medical history non-contributory     Patient Active Problem List   Diagnosis Date Noted   Language barrier 03/02/2018   Normal delivery at term 03/01/2018    Past Surgical History:  Procedure Laterality Date   NO PAST SURGERIES      OB History     Gravida  1   Para      Term      Preterm      AB      Living  0      SAB      IAB      Ectopic      Multiple      Live Births  0            Home Medications    Prior to Admission medications   Medication Sig Start Date End Date Taking? Authorizing Provider  hydrocortisone cream 1 % Apply to affected area 3 times daily 03/15/22  Yes Starr Engel, Wells Guiles, PA-C  Polyethylene Glycol 4500 POWD Take 17 g by mouth daily. Dissolve capful in glass of water, once daily 03/15/22  Yes Yanci Bachtell, Wells Guiles, PA-C    Family History Family History  Problem Relation Age of Onset   Healthy Mother    Healthy Father     Social History Social History   Tobacco Use   Smoking status: Never   Smokeless tobacco: Never  Vaping Use   Vaping Use: Never used  Substance Use Topics   Alcohol use: Never   Drug use: Never     Allergies   Patient has no known allergies.   Review of Systems Review of Systems As per HPI  Physical Exam Triage  Vital Signs ED Triage Vitals  Enc Vitals Group     BP 03/15/22 1116 118/76     Pulse Rate 03/15/22 1116 81     Resp 03/15/22 1116 16     Temp 03/15/22 1116 98.9 F (37.2 C)     Temp Source 03/15/22 1116 Oral     SpO2 03/15/22 1116 99 %     Weight --      Height --      Head Circumference --      Peak Flow --      Pain Score 03/15/22 1123 5     Pain Loc --      Pain Edu? --      Excl. in [place]? --    No data found.  Updated Vital Signs BP 118/76 (BP Location: Right Arm)   Pulse 81   Temp 98.9 F (37.2 C) (Oral)   Resp 16   LMP 03/01/2022 (Approximate)   SpO2 99%    Physical Exam Vitals and nursing note reviewed. Exam conducted  with a chaperone present [name] CMA).  HENT:     Mouth/Throat:     Pharynx: Oropharynx is clear.  Cardiovascular:     Rate and Rhythm: Normal rate and regular rhythm.  Pulmonary:     Effort: Pulmonary effort is normal.  Abdominal:     Tenderness: There is no abdominal tenderness. There is no guarding.  Genitourinary:    Vagina: Normal.     Rectum: Mass present.       Comments: Skin colored, somewhat firm mass covering anal opening. When moved, appears to originate from posterior anal opening. Possible hemorrhoid. A little tender. No fissure Neurological:     Mental Status: She is oriented to person, place, and time.     UC Treatments / Results  Labs (all labs ordered are listed, but only abnormal results are displayed) Labs Reviewed - No data to display  EKG  Radiology No results found.  Procedures Procedures (including critical care time)  Medications Ordered in UC Medications - No data to display  Initial Impression / Assessment and Plan / UC Course  I have reviewed the triage vital signs and the nursing notes.  Pertinent labs & imaging results that were available during my care of the patient were reviewed by me and considered in my medical decision making (see chart for details).  Anal mass, consider hemorrhoid No  stalk to suggest skin tag No sign of thrombosis at this time  Discussed trying preparation-H 3x daily for the next week, in combination with once daily miralax. Discussed keeping stool soft will be key to reducing size and pain. Increase fluid and fiber intake. Set up with PCP via open scheduling, appointment is next week. Return precautions discussed. Patient agrees to plan  Final Clinical Impressions(s) / UC Diagnoses   Final diagnoses:  Hemorrhoids, unspecified hemorrhoid type  Mass of anus     Discharge Instructions      Apply the cream to the hemorrhoid three times daily for the next week. Do not use longer than 7 days.  Use a capful of powder dissolved in water, once daily, to soften stool. Continue for as long as needed to keep from having hard stool.  Please follow with your primary care provider at your visit next week.    ED Prescriptions     Medication Sig Dispense Auth. Provider   hydrocortisone cream 1 % Apply to affected area 3 times daily 15 g Arthuro Canelo, PA-C   Polyethylene Glycol 4500 POWD Take 17 g by mouth daily. Dissolve capful in glass of water, once daily 500 g Zeynab Klett, Wells Guiles, PA-C      PDMP not reviewed this encounter.   Kyra Leyland 03/15/22 1247

## 2022-03-15 NOTE — Discharge Instructions (Signed)
Apply the cream to the hemorrhoid three times daily for the next week. Do not use longer than 7 days.  Use a capful of powder dissolved in water, once daily, to soften stool. Continue for as long as needed to keep from having hard stool.  Please follow with your primary care provider at your visit next week.

## 2022-03-23 ENCOUNTER — Telehealth: Payer: Self-pay | Admitting: General Practice

## 2022-03-23 ENCOUNTER — Ambulatory Visit: Payer: Medicaid Other | Admitting: Family Medicine

## 2022-03-23 NOTE — Telephone Encounter (Signed)
Pt was a no show for a NP app with Dr Gena Fray on 03/23/22, I did not send a letter. She has medicaid.

## 2022-03-25 NOTE — Telephone Encounter (Signed)
Unable to reschedule at [facility] 

## 2022-11-10 LAB — OB RESULTS CONSOLE HIV ANTIBODY (ROUTINE TESTING): HIV: NONREACTIVE

## 2022-11-10 LAB — OB RESULTS CONSOLE HEPATITIS B SURFACE ANTIGEN: Hepatitis B Surface Ag: NEGATIVE

## 2022-11-10 LAB — OB RESULTS CONSOLE RUBELLA ANTIBODY, IGM: Rubella: IMMUNE

## 2023-01-03 ENCOUNTER — Other Ambulatory Visit: Payer: Self-pay | Admitting: Family Medicine

## 2023-01-03 DIAGNOSIS — Z363 Encounter for antenatal screening for malformations: Secondary | ICD-10-CM

## 2023-01-09 ENCOUNTER — Other Ambulatory Visit: Payer: Self-pay

## 2023-01-12 ENCOUNTER — Encounter: Payer: Self-pay | Admitting: *Deleted

## 2023-01-12 DIAGNOSIS — D582 Other hemoglobinopathies: Secondary | ICD-10-CM | POA: Insufficient documentation

## 2023-01-13 ENCOUNTER — Other Ambulatory Visit: Payer: Self-pay

## 2023-01-13 ENCOUNTER — Ambulatory Visit (HOSPITAL_BASED_OUTPATIENT_CLINIC_OR_DEPARTMENT_OTHER): Payer: Medicaid Other

## 2023-01-13 ENCOUNTER — Ambulatory Visit: Payer: Medicaid Other | Attending: Family Medicine

## 2023-01-13 ENCOUNTER — Encounter: Payer: Self-pay | Admitting: *Deleted

## 2023-01-13 ENCOUNTER — Ambulatory Visit: Payer: Medicaid Other | Admitting: *Deleted

## 2023-01-13 VITALS — BP 99/54 | HR 69

## 2023-01-13 DIAGNOSIS — D582 Other hemoglobinopathies: Secondary | ICD-10-CM | POA: Insufficient documentation

## 2023-01-13 DIAGNOSIS — D563 Thalassemia minor: Secondary | ICD-10-CM | POA: Diagnosis present

## 2023-01-13 DIAGNOSIS — Z363 Encounter for antenatal screening for malformations: Secondary | ICD-10-CM | POA: Insufficient documentation

## 2023-01-13 NOTE — Progress Notes (Signed)
[facility] for [facility] at [facility] [address], [address] Phone:  [phone]   Fax:  [phone]      In-Person Genetic Counseling Clinic Note:   I spoke with 27 y.o. yo Crystal Griffith today to discuss her carrier screening results. 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 She was referred by Wendi Snipes, FNP. She was accompanied by her partner Verdie Mosher. A [place] interpreter, Enid Derry, was utilized during the session.   Pregnancy History:    G2P1001. EGA: [redacted]w[redacted]d by [place]. EDD: 05/26/2023. This is Crystal Griffith's second pregnancy. She has one child. Reports she takes PNVs and stool softener. Denies personal history of diabetes, high blood pressure, thyroid conditions, and seizures. Denies bleeding, infections, and fevers in this pregnancy. Denies using tobacco, alcohol, or street drugs in this pregnancy.   Family History:    A three-generation pedigree was created and scanned into Epic under the Media tab.  Maternal ethnicity reported as [place] and paternal ethnicity reported as [place] Denies Ashkenazi Jewish ancestry.  Family history not remarkable for consanguinity, individuals with birth defects, intellectual disability, autism spectrum disorder, multiple spontaneous abortions, still births, or unexplained neonatal death.   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 People with this condition may have symptoms similar to hemoglobin H disease, including chronic anemia, liver disease, and bone changes. Some affected  individuals do not require blood transfusions while others may require occasional blood transfusions throughout their lifetime.  I briefly discussed these test results with Dr. Jeanie Sewer, hematology-oncology. He reviewed that since Crystal Griffith's MCV, MCH, and Hgb studies were only slightly low, then no referral to hematology is indicated at the moment. He did suggest that ferritin and iron panels be ordered to ensure no iron overload.  We reviewed alpha globin genes genes, hemoglobin, forms of alpha thalassemia, and the autosomal recessive mode of inheritance. We discussed that Crystal Griffith will pass down either a functional alpha globin gene and the Constant Spring variant OR a functional alpha globin gene and the 3.7 deletion. Either way, Crystal Griffith's current and future pregnancies will inherit one functional alpha globin gene from her, and there is no increased risk for hemoglobin Bart's disease due to four deletions of the alpha globin genes (--/--). However, depending on her reproductive partner's carrier screening results, there may be risk for hemoglobin H disease (--/-?), hemoglobin Constant Spring disease (??CS/??CS), or hemoglobin H-Constant Spring disease (--/??CS).   We reviewed that carrier screening for Crystal Griffith's reproductive partner will allow [place] to assess risk to the pregnancy and possible conditions more accurately. He had carrier screening performed at their [facility] office, and the results are pending.  We reviewed that depending on his results, prenatal diagnosis through amniocentesis would be offered to determine if the fetus is affected. The technical aspects, benefits, risks, and limitations of amniocentesis including the 1 in 500 risk for miscarriage were discussed. If prenatal diagnosis is not desired, genetic testing can be performed postnatally.  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 She  carries the pathogenic variant c.79G>A  (p.E27K) in one of her HBB genes.  We reviewed beta globin genes, hemoglobin, forms of beta-hemoglobinopathies, and the autosomal mode of inheritance. Abella will either pass down either the normal beta globin gene that produces the normal hemoglobin A or the altered beta globin gene that produces hemoglobin E.  There are several types of beta-hemoglobinopathies, including hemoglobin E, hemoglobin S, hemoglobin C, and beta-thalassemia. If Crystal Griffith's reproductive partner is found to be a carrier for for a beta-hemoglobinopathy, there would be a 25% chance that this pregnancy would be affected. The type of beta-hemoglobinopathy and symptoms will vary depending on the genotype.   Crystal Griffith's reproductive partner's carrier screening results are pending.  Prenatal diagnosis through amniocentesis would be offered if Crystal Griffith's partner was also found to be a carrier for beta-hemoglobinopathy. Alternatively, testing can be completed postnatally. The [place] Newborn Screening (NBS) program will screen all newborn babies for beta-hemoglobinopathies and numerous other conditions. Of note, the NBS program does not screen for alpha thalassemia.   Previous Testing Completed:  Low risk NIPS: Crystal Griffith previously completed noninvasive prenatal screening (NIPS) in this pregnancy. The result is low risk, consistent with a female fetus. This screening significantly reduces but does not eliminate the chance that the current pregnancy has Down syndrome (trisomy 57), trisomy 87, trisomy 31, common sex chromosome conditions, and 22q11.2 microdeletion syndrome. Please see report for details. There are many genetic conditions that cannot be detected by NIPS.    Plan of Care:   Partner's carrier screening results are pending. We will call the patient with the results when they return. Ferritin and iron studies to be ordered at patient's OB office to ensure no iron overload.   Informed consent was obtained. All questions were  answered.   I spent 40 minutes in the care of the patient today, including face-to-face time reviewing and  discussing the genetic test results and available next steps.    Thank you for sharing in the care of Crystal Griffith with [place].  Please do not hesitate to contact us at [phone] if you have any questions.   Sheppard Plumber, MS Genetic Counselor   Genetic counseling student involved in appointment: Yes Delorise Jackson Ladean Raya).

## 2023-01-24 ENCOUNTER — Telehealth: Payer: Self-pay

## 2023-01-24 ENCOUNTER — Ambulatory Visit: Payer: Self-pay

## 2023-01-24 NOTE — Telephone Encounter (Signed)
With the aid of a [place] interpreter ID [id number], we left a voicemail asking patient to call back regarding test results.

## 2023-01-27 ENCOUNTER — Telehealth: Payer: Self-pay

## 2023-01-27 NOTE — Telephone Encounter (Signed)
With the aid of a [place] interpreter ID: [id number], I called the patient to discuss her partner's carrier screening results (Romah Liu DOB 09/03/1993). He was not found to be a carrier for alpha thalassemia, beta-hemoglobinopathies, cystic fibrosis, or spinal muscular atrophy. Please see report for details. A negative result on carrier screening reduces but does not eliminate the chance of being a carrier. The chance this couple's current and future pregnancies would be affected with the listed conditions is very low.  Sheppard Plumber, MS Genetic Counselor [facility] [phone]

## 2023-02-22 NOTE — L&D Delivery Note (Signed)
 OB/GYN Faculty Practice Delivery Note  Crystal Griffith is a 28 y.o. G2P1001 s/p SVD at [redacted]w[redacted]d. She was admitted for SOL.   ROM: 1h 64m with clear fluid GBS Status: Negative   Maximum Maternal Temperature: 98.85F  Labor Progress: Initial SVE: 5/70/-2. Augmented with AROM @0402 . She then progressed to complete @0535 .   Delivery Date/Time: 05/21/2023 @0546    Delivery: Called to room and patient was feeling increased pressure and requesting AROM. Head delivered LOA. No nuchal cord present. Shoulder and body delivered in usual fashion. Infant with spontaneous cry, placed on mother's abdomen, dried and stimulated. Cord clamped x 2 after 1-minute delay, and cut by FOB. Cord blood drawn. Placenta delivered spontaneously with gentle cord traction. Fundus firm with massage and Pitocin. Labia, perineum, vagina, and cervix inspected with 2nd degree perineal laceration. Repaired with 3.0 Vicryl in standard fashion.   Baby Weight: pending  Placenta: 3 vessel, intact. Sent to L&D Complications: None Lacerations: 2nd degree perineal, repaired EBL: 167 mL Analgesia: Lidocaine    Infant:  APGAR (1 MIN): 8  APGAR (5 MINS): 9   Wyn Forster, MD OB Family Medicine Fellow, [facility] for [facility], [facility] 05/21/2023, 6:48 AM

## 2023-03-09 LAB — OB RESULTS CONSOLE RPR: RPR: NONREACTIVE

## 2023-05-04 LAB — OB RESULTS CONSOLE GBS: GBS: NEGATIVE

## 2023-05-21 ENCOUNTER — Other Ambulatory Visit: Payer: Self-pay

## 2023-05-21 ENCOUNTER — Inpatient Hospital Stay (HOSPITAL_COMMUNITY)
Admission: AD | Admit: 2023-05-21 | Discharge: 2023-05-22 | DRG: 807 | Disposition: A | Discharge status: Home / Self Care | Attending: Obstetrics and Gynecology | Admitting: Obstetrics and Gynecology

## 2023-05-21 ENCOUNTER — Encounter (HOSPITAL_COMMUNITY): Payer: Self-pay | Admitting: Obstetrics and Gynecology

## 2023-05-21 DIAGNOSIS — Z3A39 39 weeks gestation of pregnancy: Secondary | ICD-10-CM

## 2023-05-21 DIAGNOSIS — O26893 Other specified pregnancy related conditions, third trimester: Secondary | ICD-10-CM | POA: Diagnosis present

## 2023-05-21 LAB — CBC
HCT: 36.4 % (ref 36.0–46.0)
Hemoglobin: 11.6 g/dL — ABNORMAL LOW (ref 12.0–15.0)
MCH: 23.9 pg — ABNORMAL LOW (ref 26.0–34.0)
MCHC: 31.9 g/dL (ref 30.0–36.0)
MCV: 74.9 fL — ABNORMAL LOW (ref 80.0–100.0)
Platelets: 301 10*3/uL (ref 150–400)
RBC: 4.86 MIL/uL (ref 3.87–5.11)
RDW: 13.4 % (ref 11.5–15.5)
WBC: 13.1 10*3/uL — ABNORMAL HIGH (ref 4.0–10.5)
nRBC: 0 % (ref 0.0–0.2)

## 2023-05-21 LAB — COMPREHENSIVE METABOLIC PANEL WITH GFR
ALT: 17 U/L (ref 0–44)
AST: 22 U/L (ref 15–41)
Albumin: 3 g/dL — ABNORMAL LOW (ref 3.5–5.0)
Alkaline Phosphatase: 164 U/L — ABNORMAL HIGH (ref 38–126)
Anion gap: 9 (ref 5–15)
BUN: <5 mg/dL — ABNORMAL LOW (ref 6–20)
CO2: 20 mmol/L — ABNORMAL LOW (ref 22–32)
Calcium: 8.7 mg/dL — ABNORMAL LOW (ref 8.9–10.3)
Chloride: 107 mmol/L (ref 98–111)
Creatinine, Ser: 0.58 mg/dL (ref 0.44–1.00)
GFR, Estimated: >60 mL/min (ref >60)
Glucose, Bld: 94 mg/dL (ref 70–99)
Potassium: 3 mmol/L — ABNORMAL LOW (ref 3.5–5.1)
Sodium: 136 mmol/L (ref 135–145)
Total Bilirubin: 0.6 mg/dL (ref 0.0–1.2)
Total Protein: 7.1 g/dL (ref 6.5–8.1)

## 2023-05-21 LAB — TYPE AND SCREEN
ABO/RH(D): A POS
Antibody Screen: NEGATIVE

## 2023-05-21 LAB — RPR: RPR Ser Ql: NONREACTIVE

## 2023-05-21 MED ORDER — ONDANSETRON HCL 4 MG PO TABS: 4.0000 mg | ORAL_TABLET | ORAL | Status: DC | PRN

## 2023-05-21 MED ORDER — ACETAMINOPHEN 325 MG PO TABS: 650.0000 mg | ORAL_TABLET | ORAL | Status: DC | PRN

## 2023-05-21 MED ORDER — COCONUT OIL OIL: 1.0000 | TOPICAL_OIL | Status: DC | PRN

## 2023-05-21 MED ORDER — OXYTOCIN-SODIUM CHLORIDE 30-0.9 UT/500ML-% IV SOLN
2.5000 [IU]/h | INTRAVENOUS | Status: DC
  Administered 2023-05-21: 2.5 [IU]/h via INTRAVENOUS
  Filled 2023-05-21: qty 500

## 2023-05-21 MED ORDER — LACTATED RINGERS IV SOLN: INTRAVENOUS | Status: DC

## 2023-05-21 MED ORDER — SODIUM CHLORIDE 0.9% FLUSH: 3.0000 mL | INTRAVENOUS | Status: DC | PRN

## 2023-05-21 MED ORDER — SOD CITRATE-CITRIC ACID 500-334 MG/5ML PO SOLN: 30.0000 mL | ORAL | Status: DC | PRN

## 2023-05-21 MED ORDER — LACTATED RINGERS IV SOLN: 500.0000 mL | INTRAVENOUS | Status: DC | PRN

## 2023-05-21 MED ORDER — SENNOSIDES-DOCUSATE SODIUM 8.6-50 MG PO TABS
2.0000 | ORAL_TABLET | ORAL | Status: DC
  Administered 2023-05-21 – 2023-05-22 (×2): 2 via ORAL
  Filled 2023-05-21 (×2): qty 2

## 2023-05-21 MED ORDER — ONDANSETRON HCL 4 MG/2ML IJ SOLN: 4.0000 mg | INTRAMUSCULAR | Status: DC | PRN

## 2023-05-21 MED ORDER — SODIUM CHLORIDE 0.9% FLUSH: 3.0000 mL | Freq: Two times a day (BID) | INTRAVENOUS | Status: DC

## 2023-05-21 MED ORDER — ONDANSETRON HCL 4 MG/2ML IJ SOLN: 4.0000 mg | Freq: Four times a day (QID) | INTRAMUSCULAR | Status: DC | PRN

## 2023-05-21 MED ORDER — TETANUS-DIPHTH-ACELL PERTUSSIS 5-2.5-18.5 LF-MCG/0.5 IM SUSY: 0.5000 mL | PREFILLED_SYRINGE | Freq: Once | INTRAMUSCULAR | Status: DC

## 2023-05-21 MED ORDER — PRENATAL MULTIVITAMIN CH
1.0000 | ORAL_TABLET | Freq: Every day | ORAL | Status: DC
  Administered 2023-05-21: 1 via ORAL
  Filled 2023-05-21: qty 1

## 2023-05-21 MED ORDER — OXYCODONE HCL 5 MG PO TABS: 5.0000 mg | ORAL_TABLET | ORAL | Status: DC | PRN

## 2023-05-21 MED ORDER — LIDOCAINE HCL (PF) 1 % IJ SOLN
30.0000 mL | INTRAMUSCULAR | Status: AC | PRN
  Administered 2023-05-21: 30 mL via SUBCUTANEOUS
  Filled 2023-05-21: qty 30

## 2023-05-21 MED ORDER — IBUPROFEN 600 MG PO TABS
600.0000 mg | ORAL_TABLET | Freq: Four times a day (QID) | ORAL | Status: DC
  Administered 2023-05-21 – 2023-05-22 (×5): 600 mg via ORAL
  Filled 2023-05-21 (×5): qty 1

## 2023-05-21 MED ORDER — SIMETHICONE 80 MG PO CHEW: 80.0000 mg | CHEWABLE_TABLET | ORAL | Status: DC | PRN

## 2023-05-21 MED ORDER — DIPHENHYDRAMINE HCL 25 MG PO CAPS: 25.0000 mg | ORAL_CAPSULE | Freq: Four times a day (QID) | ORAL | Status: DC | PRN

## 2023-05-21 MED ORDER — OXYCODONE-ACETAMINOPHEN 5-325 MG PO TABS: 2.0000 | ORAL_TABLET | ORAL | Status: DC | PRN

## 2023-05-21 MED ORDER — OXYTOCIN BOLUS FROM INFUSION
333.0000 mL | Freq: Once | INTRAVENOUS | Status: AC
  Administered 2023-05-21: 333 mL via INTRAVENOUS

## 2023-05-21 MED ORDER — WITCH HAZEL-GLYCERIN EX PADS: 1.0000 | MEDICATED_PAD | CUTANEOUS | Status: DC | PRN

## 2023-05-21 MED ORDER — FENTANYL CITRATE (PF) 100 MCG/2ML IJ SOLN
50.0000 ug | INTRAMUSCULAR | Status: DC | PRN
  Administered 2023-05-21 (×2): 100 ug via INTRAVENOUS
  Filled 2023-05-21 (×2): qty 2

## 2023-05-21 MED ORDER — MEASLES, MUMPS & RUBELLA VAC IJ SOLR: 0.5000 mL | Freq: Once | INTRAMUSCULAR | Status: DC

## 2023-05-21 MED ORDER — SODIUM CHLORIDE 0.9 % IV SOLN: 250.0000 mL | INTRAVENOUS | Status: DC | PRN

## 2023-05-21 MED ORDER — ZOLPIDEM TARTRATE 5 MG PO TABS: 5.0000 mg | ORAL_TABLET | Freq: Every evening | ORAL | Status: DC | PRN

## 2023-05-21 MED ORDER — BENZOCAINE-MENTHOL 20-0.5 % EX AERO
1.0000 | INHALATION_SPRAY | CUTANEOUS | Status: DC | PRN
  Filled 2023-05-21: qty 56

## 2023-05-21 MED ORDER — DIBUCAINE (PERIANAL) 1 % EX OINT: 1.0000 | TOPICAL_OINTMENT | CUTANEOUS | Status: DC | PRN

## 2023-05-21 MED ORDER — OXYCODONE-ACETAMINOPHEN 5-325 MG PO TABS: 1.0000 | ORAL_TABLET | ORAL | Status: DC | PRN

## 2023-05-21 NOTE — MAU Note (Signed)
.  Crystal Griffith is a 28 y.o. at [redacted]w[redacted]d here in MAU reporting:   Ctxs every 5 min,  No lof, no bleeding, +FM.   Vitals:   05/21/23 0231  BP: 118/67  Pulse: 80  Resp: 18  Temp: (!) 97.5 F (36.4 C)     Pain score: 8/10 cxts.   FHT: 135 Lab orders placed from triage: Labor eval

## 2023-05-21 NOTE — Lactation Note (Signed)
 This note was copied from a baby's chart. Lactation Consultation Note  Patient Name: Crystal Griffith [id number] Date: 05/21/2023 Age:27 hours Reason for consult: Initial assessment;Term;Breastfeeding assistance  P2- MOB plans to offer both breast milk and formula. LC encouraged MOB to always offer breast first, then formula. When asked if she needs an interpreter, MOB declined the use of the interpreter and reported that FOB is the one who needs the interpreter. MOB understood the entire consult with LC. MOB requested to hand express to see some colostrum. LC expressed twice, but no colostrum was noted. MOB has hyper sensitive breasts and does not like hand expression. Infant was rooting on MOB's chest during the consult, so LC encouraged attempting a latch. MOB consented. LC placed infant on the left breast in the football hold. Infant latched immediately and nursed for 7 minutes before falling asleep. Infant was placed back on MOB's chest for STS.  LC reviewed the first 24 hr birthday nap, day 2 cluster feeding, feeding infant on cue 8-12x in 24 hrs, not allowing infant to go over 3 hrs without a  feeding, CDC milk storage guidelines, LC services handout and engorgement/breast care. LC encouraged MOB to call for further assistance as needed.  Maternal Data Has patient been taught Hand Expression?: Yes Does the patient have breastfeeding experience prior to this delivery?: Yes How long did the patient breastfeed?: 4 months  Feeding Mother's Current Feeding Choice: Breast Milk and Formula  LATCH Score Latch: Grasps breast easily, tongue down, lips flanged, rhythmical sucking.  Audible Swallowing: A few with stimulation  Type of Nipple: Everted at rest and after stimulation  Comfort (Breast/Nipple): Soft / non-tender  Hold (Positioning): Assistance needed to correctly position infant at breast and maintain latch.  LATCH Score: 8   Lactation Tools Discussed/Used Pump Education: Milk  Storage  Interventions Interventions: Breast feeding basics reviewed;Assisted with latch;Hand express;Breast compression;Adjust position;Support pillows;Position options;Education;LC Services brochure  Discharge Discharge Education: Engorgement and breast care;Warning signs for feeding baby Pump: DEBP;Personal  Consult Status Consult Status: Follow-up Date: 05/22/23 Follow-up type: In-patient    Dema Severin BS, IBCLC 05/21/2023, 5:49 PM

## 2023-05-21 NOTE — Progress Notes (Signed)
 Pt request AROM at this time.  IV fentanyl for pain  Blood pressure 118/67, pulse 80, temperature (!) 97.5 F (36.4 C), temperature source Oral, resp. rate 18, height 5\' 1"  (1.549 m), weight 83 kg, last menstrual period 07/19/2022, unknown if currently breastfeeding.  EFM: baseline 135, accels, no decels, moderate variability TOCO: q2-24min contractions  Dilation: 6 Effacement (%): 80, 90 Cervical Position: Middle Station: -2 Presentation: Vertex Exam by:: Amilee Janvier  AROM clear mixed with bloody show, small amount Continue expectant management Anticipate vaginal delivery  Wyn Forster, MD FMOB Fellow, Faculty practice [facility] Center for [facility]

## 2023-05-21 NOTE — Discharge Summary (Signed)
 Postpartum Discharge Summary  Date of Service updated ***     Patient Name: Crystal Griffith DOB: [birth year] MRN: [id number]  Date of admission: 05/21/2023 Delivery date:05/21/2023 Delivering provider: Wyn Forster Date of discharge: 05/21/2023  Admitting diagnosis: Normal labor [O80, Z37.9] Intrauterine pregnancy: [redacted]w[redacted]d     Secondary diagnosis:  Principal Problem:   NSVD (normal spontaneous vaginal delivery) Active Problems:   Obstetrical laceration, second degree  Additional problems: ***    Discharge diagnosis: Term Pregnancy Delivered                                              Post partum procedures:{Postpartum procedures:23558} Augmentation: AROM Complications: {OB Labor/Delivery Complications:20784}  Hospital course: Onset of Labor With Vaginal Delivery      28 y.o. yo G2P1001 at [redacted]w[redacted]d was admitted in Active Labor on 05/21/2023. Labor course was uncomplicated.  Membrane Rupture Time/Date: 4:02 AM,05/21/2023  Delivery Method:Vaginal, Spontaneous Operative Delivery:N/A Episiotomy: None Lacerations:  2nd degree;Perineal Patient had a postpartum course complicated by ***.  She is ambulating, tolerating a regular diet, passing flatus, and urinating well. Patient is discharged home in stable condition on 05/21/23.  Newborn Data: Birth date:05/21/2023 Birth time:5:46 AM Gender:Female Living status:Living Apgars:8 ,9  Weight:   Magnesium Sulfate received: {Mag received:30440022} BMZ received: No Rhophylac:N/A MMR:Yes T-DaP:Given prenatally Flu: Yes RSV Vaccine received: No Transfusion:{Transfusion received:30440034}  Immunizations received: There is no immunization history for the selected administration types on file for this patient.  Physical exam  Vitals:   05/21/23 0330 05/21/23 0603 05/21/23 0615 05/21/23 0630  BP: 122/71 113/82 127/72 (!) 113/58  Pulse: 84 (!) 120 97 81  Resp:      Temp: 98.1 F (36.7 C)     TempSrc: Oral     Weight:      Height:        General: {Exam; general:21111117} Lochia: {Desc; appropriate/inappropriate:30686::"appropriate"} Uterine Fundus: {Desc; firm/soft:30687} Incision: {Exam; incision:21111123} DVT Evaluation: {Exam; dvt:2111122} Labs: Lab Results  Component Value Date   WBC 13.1 (H) 05/21/2023   HGB 11.6 (L) 05/21/2023   HCT 36.4 05/21/2023   MCV 74.9 (L) 05/21/2023   PLT 301 05/21/2023      Latest Ref Rng & Units 05/21/2023    2:54 AM  CMP  Glucose 70 - 99 mg/dL 94   BUN 6 - 20 mg/dL <5   Creatinine 4.69 - 1.00 mg/dL 6.29   Sodium 528 - 413 mmol/L 136   Potassium 3.5 - 5.1 mmol/L 3.0   Chloride 98 - 111 mmol/L 107   CO2 22 - 32 mmol/L 20   Calcium 8.9 - 10.3 mg/dL 8.7   Total Protein 6.5 - 8.1 g/dL 7.1   Total Bilirubin 0.0 - 1.2 mg/dL 0.6   Alkaline Phos 38 - 126 U/L 164   AST 15 - 41 U/L 22   ALT 0 - 44 U/L 17    Edinburgh Score:    03/02/2018    8:15 PM  Edinburgh Postnatal Depression Scale Screening Tool  I have been able to laugh and see the funny side of things. 0  I have looked forward with enjoyment to things. 0  I have blamed myself unnecessarily when things went wrong. 0  I have been anxious or worried for no good reason. 1  I have felt scared or panicky for no good reason. 0  Things have been getting  on top of me. 1  I have been so unhappy that I have had difficulty sleeping. 3  I have felt sad or miserable. 3  I have been so unhappy that I have been crying. 1  The thought of harming myself has occurred to me. 1  Edinburgh Postnatal Depression Scale Total 10   No data recorded  After visit meds:  Allergies as of 05/21/2023   No Known Allergies   Med Rec must be completed prior to using this [facility]***        Discharge home in stable condition Infant Feeding: Bottle and Breast Infant Disposition:home with mother Discharge instruction: per After Visit Summary and Postpartum booklet. Activity: Advance as tolerated. Pelvic rest for 6 weeks.  Diet: routine  diet Future Appointments:No future appointments. Follow up Visit:  Follow-up Information     Department, [facility]. Schedule an appointment as soon as possible for a visit.   Why: Routine postpartum follow up in 4-6 weeks Contact information: [address] [place] [address] [phone]                  Please schedule this patient for a In person postpartum visit in 4 weeks with the following provider: Any provider. Additional Postpartum F/U:Postpartum Depression checkup  Low risk pregnancy complicated by:  Precipitous delivery Delivery mode:  Vaginal, Spontaneous Anticipated Birth Control:  IUD Paragard - out patient placement   05/21/2023 Wyn Forster, MD

## 2023-05-21 NOTE — H&P (Signed)
 OBSTETRIC ADMISSION HISTORY AND PHYSICAL  Crystal Griffith is a 28 y.o. female G2P1001 with IUP at [redacted]w[redacted]d by 15 week [place] presenting for SOL. She reports +FMs, No LOF, no VB, no blurry vision, headaches or peripheral edema, and RUQ pain.  She plans on breast and bottle feeding. She request out patient Paragard for birth control. 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 - Fetal echogenic intracardiac focus  Past Medical History: Past Medical History:  Diagnosis Date   Hemorrhoid    Medical history non-contributory     Past Surgical History: Past Surgical History:  Procedure Laterality Date   NO PAST SURGERIES      Obstetrical History: OB History     Gravida  2   Para  1   Term  1   Preterm      AB      Living  1      SAB      IAB      Ectopic      Multiple      Live Births  0           Social History Social History   Socioeconomic History   Marital status: Single    Spouse name: Not on file   Number of children: Not on file   Years of education: Not on file   Highest education level: Not on file  Occupational History   Not on file  Tobacco Use   Smoking status: Never   Smokeless tobacco: Never  Vaping Use   Vaping status: Never Used  Substance and Sexual Activity   Alcohol use: Never   Drug use: Never   Sexual activity: Yes    Birth control/protection: None  Other Topics Concern   Not on file  Social History Narrative   Not on file   Social Drivers of Health   Financial Resource Strain: Not on file  Food Insecurity: Not on file  Transportation Needs: Not on file  Physical Activity: Not on file  Stress: Not on file  Social Connections: Not on file    Family  History: Family History  Problem Relation Age of Onset   Healthy Mother    Healthy Father     Allergies: No Known Allergies  Medications Prior to Admission  Medication Sig Dispense Refill Last Dose/Taking   Prenatal Vit-Fe Fumarate-FA (PRENATAL MULTIVITAMIN) TABS tablet Take 1 tablet by mouth daily at 12 noon.   Past Week   hydrocortisone cream 1 % Apply to affected area 3 times daily (Patient not taking: Reported on 01/13/2023) 15 g 0    Polyethylene Glycol 4500 POWD Take 17 g by mouth daily. Dissolve capful in glass of water, once daily 500 g 0      Review of Systems   All systems reviewed and negative except as stated in HPI  Blood pressure 118/67, pulse 80, temperature (!) 97.5 F (36.4 C), temperature source Oral, resp. rate 18, height 5\' 1"  (1.549 m), weight 83 kg, last menstrual period 07/19/2022, unknown if currently breastfeeding. General appearance: alert, cooperative, appears stated age, and moderate distress Lungs: clear to auscultation bilaterally Heart: regular rate and rhythm Abdomen: soft, non-tender; bowel sounds normal Pelvic: adequate, proven to  Extremities: Crystal Griffith  sign is negative, no sign of DVT DTR's 2+ Presentation: cephalic Fetal monitoringBaseline: 135 bpm, Variability: Good {> 6 bpm), Accelerations: Reactive, and Decelerations: Absent Uterine activityFrequency: Every 5 minutes Dilation: 5 Effacement (%): 70 Station: -2 Exam by:: Felipa Furnace RN   Prenatal labs: ABO, Rh:  A positive Antibody:  negative Rubella:  immune RPR:   nonreactive HBsAg:   nonreactive HIV:   nonreactive GBS:     negative Lab Results  Component Value Date   GBS Negative 03/01/2018   GTT 135 1hr; 76/141/101/75 3hr; normal Genetic screening   Anatomy US 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 G2P1001 at [redacted]w[redacted]d here for SOL  #Labor: Expectant management for now. If indicated later augmentation with AROM +/- Pitocin.  #Pain: Per pt request #FWB: Cat I #GBS status:  negative #Feeding: Breastmilk  and Formula #Reproductive Life planning: IUD Paragard out patient #Circ:  no  Crystal Forster, MD  05/21/2023, 2:42 AM

## 2023-05-22 ENCOUNTER — Other Ambulatory Visit (HOSPITAL_COMMUNITY): Payer: Self-pay

## 2023-05-22 LAB — CBC
HCT: 34.7 % — ABNORMAL LOW (ref 36.0–46.0)
Hemoglobin: 11.1 g/dL — ABNORMAL LOW (ref 12.0–15.0)
MCH: 24.1 pg — ABNORMAL LOW (ref 26.0–34.0)
MCHC: 32 g/dL (ref 30.0–36.0)
MCV: 75.3 fL — ABNORMAL LOW (ref 80.0–100.0)
Platelets: 301 10*3/uL (ref 150–400)
RBC: 4.61 MIL/uL (ref 3.87–5.11)
RDW: 13.4 % (ref 11.5–15.5)
WBC: 15.6 10*3/uL — ABNORMAL HIGH (ref 4.0–10.5)
nRBC: 0 % (ref 0.0–0.2)

## 2023-05-22 MED ORDER — IBUPROFEN 600 MG PO TABS
600.0000 mg | ORAL_TABLET | Freq: Four times a day (QID) | ORAL | 0 refills | Status: AC
  Filled 2023-05-22: qty 30, 8d supply, fill #0

## 2023-05-22 MED ORDER — BENZOCAINE-MENTHOL 20-0.5 % EX AERO
1.0000 | INHALATION_SPRAY | CUTANEOUS | 0 refills | Status: AC | PRN
  Filled 2023-05-22: qty 56, fill #0

## 2023-05-22 NOTE — Lactation Note (Signed)
 This note was copied from a baby's chart. Lactation Consultation Note  Patient Name: Crystal Griffith [id number] Date: 05/22/2023 Age:27 hours  Reason for consult: Follow-up assessment;Term  P2, [redacted]w[redacted]d, 3.8% weight loss  Mother speaks [place] and declines a need for an interpreter when offered. Mother states baby is latching well but she "does not feel she has enough milk in her breast for baby" and is supplementing with formula until her full milk is in. Mother is latching baby prior to feeding  formula. Mother reports she made adequate breast milk for her first baby, 5 years ago. She reports not having breast changes with this pregnancy and worried if she will not be able to make enough milk for her baby.   Discussed breastfeeding baby frequently and additional pumping to stimulate her milk production, once home. Mother and baby are being discharged home today.  Discussed the process of milk production, "supply and demand" and the importance of breast stimulation and milk removal in order to make an optimal milk supply. Discussed mother to breastfeed 8-12 times in 24 hours, skin to skin and breast feed before formula feeding.  If missed feedings at breast or substituting feeding with formula, advised to hand express and/or pump to remove milk from the breast.   Mom made aware of O/P services, breastfeeding support groups,  and our phone # for post-discharge questions.     Feeding Mother's Current Feeding Choice: Breast Milk and Formula   Interventions Interventions: Breast feeding basics reviewed;Education  Discharge Discharge Education: Engorgement and breast care;Warning signs for feeding baby Pump: Personal;DEBP  Consult Status Consult Status: Complete Date: 05/22/23    Omar Person 05/22/2023, 12:37 PM

## 2023-05-29 ENCOUNTER — Ambulatory Visit (HOSPITAL_COMMUNITY)
Admission: EM | Admit: 2023-05-29 | Discharge: 2023-05-29 | Disposition: A | Discharge status: Home / Self Care | Attending: Physician Assistant | Admitting: Physician Assistant

## 2023-05-29 ENCOUNTER — Encounter (HOSPITAL_COMMUNITY): Payer: Self-pay

## 2023-05-29 ENCOUNTER — Encounter (HOSPITAL_COMMUNITY): Payer: Self-pay | Admitting: Emergency Medicine

## 2023-05-29 ENCOUNTER — Emergency Department (HOSPITAL_COMMUNITY)
Admission: EM | Admit: 2023-05-29 | Discharge: 2023-05-29 | Disposition: A | Discharge status: Home / Self Care | Attending: Emergency Medicine | Admitting: Emergency Medicine

## 2023-05-29 ENCOUNTER — Other Ambulatory Visit: Payer: Self-pay

## 2023-05-29 DIAGNOSIS — O9913 Other diseases of the blood and blood-forming organs and certain disorders involving the immune mechanism complicating the puerperium: Secondary | ICD-10-CM | POA: Insufficient documentation

## 2023-05-29 DIAGNOSIS — D72829 Elevated white blood cell count, unspecified: Secondary | ICD-10-CM | POA: Diagnosis not present

## 2023-05-29 DIAGNOSIS — T782XXA Anaphylactic shock, unspecified, initial encounter: Secondary | ICD-10-CM | POA: Insufficient documentation

## 2023-05-29 DIAGNOSIS — T7800XA Anaphylactic reaction due to unspecified food, initial encounter: Secondary | ICD-10-CM

## 2023-05-29 DIAGNOSIS — O9A23 Injury, poisoning and certain other consequences of external causes complicating the puerperium: Secondary | ICD-10-CM | POA: Insufficient documentation

## 2023-05-29 DIAGNOSIS — O9089 Other complications of the puerperium, not elsewhere classified: Secondary | ICD-10-CM | POA: Diagnosis present

## 2023-05-29 LAB — CBC WITH DIFFERENTIAL/PLATELET
Abs Immature Granulocytes: 0.47 10*3/uL — ABNORMAL HIGH (ref 0.00–0.07)
Basophils Absolute: 0.1 10*3/uL (ref 0.0–0.1)
Basophils Relative: 0 %
Eosinophils Absolute: 0.2 10*3/uL (ref 0.0–0.5)
Eosinophils Relative: 1 %
HCT: 44.3 % (ref 36.0–46.0)
Hemoglobin: 14.2 g/dL (ref 12.0–15.0)
Immature Granulocytes: 2 %
Lymphocytes Relative: 13 %
Lymphs Abs: 3.7 10*3/uL (ref 0.7–4.0)
MCH: 24.1 pg — ABNORMAL LOW (ref 26.0–34.0)
MCHC: 32.1 g/dL (ref 30.0–36.0)
MCV: 75.3 fL — ABNORMAL LOW (ref 80.0–100.0)
Monocytes Absolute: 1.3 10*3/uL — ABNORMAL HIGH (ref 0.1–1.0)
Monocytes Relative: 5 %
Neutro Abs: 23.6 10*3/uL — ABNORMAL HIGH (ref 1.7–7.7)
Neutrophils Relative %: 79 %
Platelets: 492 10*3/uL — ABNORMAL HIGH (ref 150–400)
RBC: 5.88 MIL/uL — ABNORMAL HIGH (ref 3.87–5.11)
RDW: 12.6 % (ref 11.5–15.5)
Smear Review: NORMAL
WBC: 29.3 10*3/uL — ABNORMAL HIGH (ref 4.0–10.5)
nRBC: 0 % (ref 0.0–0.2)

## 2023-05-29 LAB — COMPREHENSIVE METABOLIC PANEL WITH GFR
ALT: 14 U/L (ref 0–44)
AST: 28 U/L (ref 15–41)
Albumin: 3.1 g/dL — ABNORMAL LOW (ref 3.5–5.0)
Alkaline Phosphatase: 87 U/L (ref 38–126)
Anion gap: 16 — ABNORMAL HIGH (ref 5–15)
BUN: 22 mg/dL — ABNORMAL HIGH (ref 6–20)
CO2: 16 mmol/L — ABNORMAL LOW (ref 22–32)
Calcium: 8.3 mg/dL — ABNORMAL LOW (ref 8.9–10.3)
Chloride: 109 mmol/L (ref 98–111)
Creatinine, Ser: 1.07 mg/dL — ABNORMAL HIGH (ref 0.44–1.00)
GFR, Estimated: >60 mL/min (ref >60)
Glucose, Bld: 170 mg/dL — ABNORMAL HIGH (ref 70–99)
Potassium: 3.9 mmol/L (ref 3.5–5.1)
Sodium: 141 mmol/L (ref 135–145)
Total Bilirubin: 1.1 mg/dL (ref 0.0–1.2)
Total Protein: 6.6 g/dL (ref 6.5–8.1)

## 2023-05-29 LAB — HCG, QUANTITATIVE, PREGNANCY: hCG, Beta Chain, Quant, S: 24 m[IU]/mL — ABNORMAL HIGH (ref <5)

## 2023-05-29 LAB — HCG, SERUM, QUALITATIVE: Preg, Serum: POSITIVE — AB

## 2023-05-29 MED ORDER — SODIUM CHLORIDE 0.9 % IV BOLUS
1000.0000 mL | Freq: Once | INTRAVENOUS | Status: AC
  Administered 2023-05-29: 1000 mL via INTRAVENOUS

## 2023-05-29 MED ORDER — METHYLPREDNISOLONE SODIUM SUCC 125 MG IJ SOLR: 125.0000 mg | Freq: Once | INTRAMUSCULAR | Status: DC

## 2023-05-29 MED ORDER — EPINEPHRINE 0.3 MG/0.3ML IJ SOAJ
INTRAMUSCULAR | Status: AC
  Filled 2023-05-29: qty 0.3

## 2023-05-29 MED ORDER — EPINEPHRINE 0.3 MG/0.3ML IJ SOAJ
0.3000 mg | Freq: Once | INTRAMUSCULAR | Status: AC
  Administered 2023-05-29: 0.3 mg via INTRAMUSCULAR

## 2023-05-29 MED ORDER — FAMOTIDINE IN NACL 20-0.9 MG/50ML-% IV SOLN
20.0000 mg | Freq: Once | INTRAVENOUS | Status: AC
  Administered 2023-05-29: 20 mg via INTRAVENOUS
  Filled 2023-05-29: qty 50

## 2023-05-29 MED ORDER — DIPHENHYDRAMINE HCL 50 MG/ML IJ SOLN
25.0000 mg | Freq: Once | INTRAMUSCULAR | Status: AC
  Administered 2023-05-29: 25 mg via INTRAVENOUS

## 2023-05-29 MED ORDER — METHYLPREDNISOLONE SODIUM SUCC 125 MG IJ SOLR
125.0000 mg | Freq: Once | INTRAMUSCULAR | Status: AC
  Administered 2023-05-29: 125 mg via INTRAVENOUS

## 2023-05-29 MED ORDER — EPINEPHRINE 0.3 MG/0.3ML IJ SOAJ
0.3000 mg | INTRAMUSCULAR | 0 refills | Status: AC | PRN
Start: 2023-05-29 — End: ?

## 2023-05-29 MED ORDER — SODIUM CHLORIDE 0.9 % IV SOLN: INTRAVENOUS | Status: DC

## 2023-05-29 MED ORDER — METHYLPREDNISOLONE SODIUM SUCC 125 MG IJ SOLR
INTRAMUSCULAR | Status: AC
  Filled 2023-05-29: qty 2

## 2023-05-29 MED ORDER — FAMOTIDINE IN NACL 20-0.9 MG/50ML-% IV SOLN: 20.0000 mg | Freq: Once | INTRAVENOUS | Status: DC

## 2023-05-29 MED ORDER — DIPHENHYDRAMINE HCL 50 MG/ML IJ SOLN
INTRAMUSCULAR | Status: AC
  Filled 2023-05-29: qty 1

## 2023-05-29 NOTE — ED Provider Notes (Addendum)
 [place] EMERGENCY DEPARTMENT AT [facility] Provider Note   CSN: [id number] Arrival date & time: 05/29/23  1555     History  Chief Complaint  Patient presents with   Allergic Reaction   Hypotension    Crystal Griffith is a 28 y.o. female 1 week postpartum presents from urgent care due to concern for allergic reaction.  Patient states that she had crab around 11 AM, began having lip swelling and itching about 5 minutes later.  She states she took some medicine at home, unspecified oral medication, with minimal relief.  She experienced full body swelling, vomiting, and itching.  She then presented to urgent care where she was given IM epi, Solu-Medrol , and Benadryl .  She also received a liter of fluids.  Of note, she is 1 week postpartum.  Epinephrine  was administered at around 4 PM.   Allergic Reaction      Home Medications Prior to Admission medications   Medication Sig Start Date End Date Taking? Authorizing Provider  EPINEPHrine  (EPIPEN  2-PAK) 0.3 mg/0.3 mL IJ SOAJ injection Inject 0.3 mg into the muscle as needed for anaphylaxis. 05/29/23  Yes Lorain Robson, MD  benzocaine -Menthol  (DERMOPLAST) 20-0.5 % AERO Apply 1 Application topically as needed for irritation (perineal discomfort). 05/22/23   Stinson, Jacob J, DO  ibuprofen  (ADVIL ) 600 MG tablet Take 1 tablet (600 mg total) by mouth every 6 (six) hours. 05/22/23   Stinson, Jacob J, DO  Prenatal Vit-Fe Fumarate-FA (PRENATAL MULTIVITAMIN) TABS tablet Take 1 tablet by mouth daily at 12 noon.    [provider]      Allergies    Patient has no known allergies.    Review of Systems   Review of Systems  Physical Exam Updated Vital Signs BP 104/60   Pulse 63   Temp 98 F (36.7 C) (Axillary)   Resp 20   Ht 5\' 1"  (1.549 m)   Wt 83 kg   LMP 07/19/2022   SpO2 100%   BMI 34.57 kg/m  Physical Exam Vitals and nursing note reviewed.  Constitutional:      Appearance: She is well-developed.  HENT:     Head:  Normocephalic and atraumatic.  Eyes:     Conjunctiva/sclera: Conjunctivae normal.  Cardiovascular:     Rate and Rhythm: Normal rate and regular rhythm.     Heart sounds: No murmur heard. Pulmonary:     Effort: Pulmonary effort is normal.     Breath sounds: Normal breath sounds.     Comments: Intermittent wheezing Abdominal:     General: There is no distension.     Palpations: Abdomen is soft.     Tenderness: There is no abdominal tenderness. There is no guarding or rebound.  Musculoskeletal:     Cervical back: Neck supple.  Skin:    General: Skin is warm and dry.     Capillary Refill: Capillary refill takes less than 2 seconds.     Comments: Diffuse, blanchable erythema  Neurological:     Mental Status: She is alert.  Psychiatric:        Mood and Affect: Mood normal.     ED Results / Procedures / Treatments   Labs (all labs ordered are listed, but only abnormal results are displayed) Labs Reviewed  CBC WITH DIFFERENTIAL/PLATELET - Abnormal; Notable for the following components:      Result Value   WBC 29.3 (*)    RBC 5.88 (*)    MCV 75.3 (*)    MCH 24.1 (*)  Platelets 492 (*)    Neutro Abs 23.6 (*)    Monocytes Absolute 1.3 (*)    Abs Immature Granulocytes 0.47 (*)    All other components within normal limits  COMPREHENSIVE METABOLIC PANEL WITH GFR - Abnormal; Notable for the following components:   CO2 16 (*)    Glucose, Bld 170 (*)    BUN 22 (*)    Creatinine, Ser 1.07 (*)    Calcium 8.3 (*)    Albumin 3.1 (*)    Anion gap 16 (*)    All other components within normal limits  HCG, SERUM, QUALITATIVE - Abnormal; Notable for the following components:   Preg, Serum POSITIVE (*)    All other components within normal limits  HCG, QUANTITATIVE, PREGNANCY - Abnormal; Notable for the following components:   hCG, Beta Chain, Quant, S 24 (*)    All other components within normal limits    EKG None  Radiology No results found.  Procedures Procedures     Medications Ordered in ED Medications  sodium chloride  0.9 % bolus 1,000 mL (0 mLs Intravenous Stopped 05/29/23 1821)  famotidine  (PEPCID ) IVPB 20 mg premix (0 mg Intravenous Stopped 05/29/23 1758)    ED Course/ Medical Decision Making/ A&P Clinical Course as of 05/29/23 1959  Mon May 29, 2023  1917 Reeval @ 8 PM [AO]    Clinical Course User Index [AO] Lorain Robson, MD                                 Medical Decision Making Amount and/or Complexity of Data Reviewed Labs: ordered.  Risk Prescription drug management.   Patient is alert, afebrile, and hemodynamically stable here in the ED in mild distress.  On auscultation, patient has mild wheezing intermittently with clear oropharynx, no stridor, and diffuse full body erythema that is blanchable.  She also has tremors.  Will obtain basic labs and administer an additional liter of fluids will continue to monitor.  Completing anaphylaxis treatment with the addition of Pepcid , will consider redosing epinephrine  upon reassessment of the patient.  Labs returned with leukocytosis of 29.3, suspect reactive, as well as hemoglobin 14.2.  CMP with bicarb 16 (suspect AG in the setting of anaphylaxis), BUN 22, creatinine 1.07.  Pregnancy test positive with beta-hCG 24, not unexpected given that she is about 1 week postpartum.  On reexamination hourly since arrival, patient endorses improved symptoms and unremarkable vital signs.  Patient did not require any repeat dosing of epinephrine , and she has clear lungs auscultation bilaterally with no oropharyngeal swelling, complete improvement and cessation of diffuse rash, and no abdominal pain on palpation.  She was observed for 4 hours in the ED after epi administration.  Spoke with her and partner at bedside extensively about anaphylaxis management and avoiding blue crab, which she endorses she ate for the first time today.  Discussed following with PCP as well as OB/GYN given her recent delivery.  I  confirmed with pharmacy that patient is safe to breast-feed when she goes home.  She is agreeable to the above plan.  Strict return precautions were given, and patient was discharged in stable condition.        Final Clinical Impression(s) / ED Diagnoses Final diagnoses:  Anaphylaxis, initial encounter    Rx / DC Orders ED Discharge Orders          Ordered    EPINEPHrine  (EPIPEN  2-PAK) 0.3 mg/0.3 mL IJ SOAJ  injection  As needed        05/29/23 Vinton Greig, MD 05/29/23 Steele Edelson, MD 05/29/23 Arlyn Lambing    Guadalupe Lee, MD 06/14/23 [phone]

## 2023-05-29 NOTE — ED Notes (Signed)
 Patient is being discharged from the Urgent Care and sent to the Emergency Department via [facility] . Per provider, patient is in need of higher level of care due to allergic reaction. Patient is aware and verbalizes understanding of plan of care.  Vitals:   05/29/23 1507  BP: 118/82  Pulse: 98  Resp: (!) 24  SpO2: 99%

## 2023-05-29 NOTE — ED Triage Notes (Addendum)
 Pt to ED from UC via EMS after allergic rx to crabs at 1100 today. Took "something" at home, but didn't help. At [facility], pt had wheezing, throat swelling, and SOB. Pt given 125 solu medrol, 1 duoneb, 1 epi, 50mg  IV benadryl, and 500 NSS. Pt hypotensive at UC 80/40. BP 122/70 after fluids until arrival pt became hypotensive again. Full body rash. Had a baby last week. Full body tremors on arrival.  18g R AC

## 2023-05-29 NOTE — Discharge Instructions (Addendum)
 You were seen in the ED for an allergic reaction.  You were given several medications prior to arrival as well as fluids.  Make sure to not eat blue crab again.  I prescribed you an EpiPen, which she should administer if you experience an allergic reaction that consists of difficulty breathing, diffuse hives, abdominal pain with vomiting.  If you administer this, please present to the ED Of note, it is safe to breast-feed once you go home.  Please return to the ED if you experience a resurgence of swelling, shortness of breath, wheezing, or of any other reason to seek emergency medical care.  B?n ? ???c ??a ??n khoa C?p c?u v ph?n ?ng d? ?ng. B?n ? ???c k m?t s? lo?i thu?c tr??c khi ??n c?ng nh? truy?n d?ch. ??m b?o khng ?n cua xanh n?a. Ti ? k ??n cho b?n m?t cy EpiPen, c ?y s? tim n?u b?n b? ph?n ?ng d? ?ng bao g?m kh th?, n?i m? ?ay lan t?a, ?au b?ng km nn. N?u b?n tim thu?c ny, vui lng ??n khoa C?p c?u L?u , b?n c th? cho con b an ton sau khi v? nh.  Vui lng quay l?i khoa C?p c?u n?u b?n b? s?ng t?y, kh th?, th? kh kh ho?c b?t k? l do no khc c?n ph?i c?p c?u.

## 2023-05-29 NOTE — ED Provider Notes (Signed)
 [facility]    CSN: [id number] Arrival date & time: 05/29/23  1459      History   Chief Complaint No chief complaint on file.   HPI Crystal Griffith is a 28 y.o. female.   Patient presents to the urgent care with her husband and brother.  Patient began feeling sick after eating crab meat.  Patient has been vomiting.  She has developed a rash.  Patient complains of itching all over.  Patient complains of feeling like her mouth is swollen.  Patient reports difficulty breathing.  Patient's husband provides a large amount of the history as patient is actively vomiting.  He reports patient has not had any food allergies in the past.  He reports she has not had any medication allergies.  He states that patient had a baby 1 week ago.  He reports no complications from delivery.  Patient complains of feeling like she is losing her vision.  The history is provided by the patient and the spouse. No language interpreter was used.    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 Authorizing Provider  benzocaine-Menthol (DERMOPLAST) 20-0.5 % AERO Apply 1 Application topically as needed for irritation (perineal discomfort). 05/22/23   Levie Heritage, DO  ibuprofen (ADVIL) 600 MG tablet Take 1 tablet (600 mg total) by mouth every 6 (six) hours. 05/22/23    Levie Heritage, DO  Prenatal Vit-Fe Fumarate-FA (PRENATAL MULTIVITAMIN) TABS tablet Take 1 tablet by mouth daily at 12 noon.    [provider]    Family History Family History  Problem Relation Age of Onset   Healthy Mother    Healthy Father     Social History Social History   Tobacco Use   Smoking status: Never   Smokeless tobacco: Never  Vaping Use   Vaping status: Never Used  Substance Use Topics   Alcohol use: Never   Drug use: Never     Allergies   Patient has no known allergies.   Review of Systems Review of Systems  Unable to perform ROS: Acuity of condition     Physical Exam Triage Vital Signs ED Triage Vitals  Encounter Vitals Group     BP 05/29/23 1507 118/82     Systolic BP Percentile --      Diastolic BP Percentile --      Pulse Rate 05/29/23 1507 98     Resp 05/29/23 1507 (!) 24     Temp --      Temp Source 05/29/23 1507 Oral  SpO2 05/29/23 1507 99 %     Weight --      Height --      Head Circumference --      Peak Flow --      Pain Score 05/29/23 1506 0     Pain Loc --      Pain Education --      Exclude from Growth Chart --    No data found.  Updated Vital Signs BP (!) 80/48 (BP Location: Left Arm)   Pulse 72   Resp 20   LMP 07/19/2022   SpO2 99%   Visual Acuity Right Eye Distance:   Left Eye Distance:   Bilateral Distance:    Right Eye Near:   Left Eye Near:    Bilateral Near:     Physical Exam Vitals and nursing note reviewed.  Constitutional:      General: She is in acute distress.     Appearance: She is well-developed.  HENT:     Head: Normocephalic.     Mouth/Throat:     Mouth: Mucous membranes are moist.     Comments: Swollen lips upper lip greater than lower Cardiovascular:     Rate and Rhythm: Normal rate.  Pulmonary:     Breath sounds: Wheezing present.  Abdominal:     General: There is no distension.  Musculoskeletal:        General: Normal range of motion.  Skin:    Comments: Sweaty   Neurological:     General: No focal deficit present.      UC Treatments / Results  Labs (all labs ordered are listed, but only abnormal results are displayed) Labs Reviewed - No data to display  EKG   Radiology No results found.  Procedures Procedures (including critical care time)  Medications Ordered in UC Medications  EPINEPHrine (EPI-PEN) injection 0.3 mg (0.3 mg Intramuscular Given 05/29/23 1552)  diphenhydrAMINE (BENADRYL) injection 25 mg (25 mg Intravenous Given 05/29/23 1520)  methylPREDNISolone sodium succinate (SOLU-MEDROL) 125 mg/2 mL injection 125 mg (125 mg Intravenous Given 05/29/23 1530)    Initial Impression / Assessment and Plan / UC Course  I have reviewed the triage vital signs and the nursing notes.  Pertinent labs & imaging results that were available during my care of the patient were reviewed by me and considered in my medical decision making (see chart for details).     Patient evaluated by me at the time of arrival.  Patient moved to exam room.  Patient is placed on a cardiac monitor and pulse ox.  Patient is given epi IM.  Patient is given Solu-Medrol and Benadryl IV.  Patient's blood pressure 65/30.  Patient given IV fluid bolus.  RN started second IV.  Patient's blood pressure improved to 80/48.  IV fluids continued.  [facility] notified of need to transport to the emergency department.  Patient reports feeling some better.  Hives appear to be resolving, decreased swelling lips.  Oxygen saturations 94 to 95%.  Patient is placed on O2 at 2 L.  Patient appears to be improving.  She is transported to [facility] emergency department for further treatment and evaluation. Final Clinical Impressions(s) / UC Diagnoses   Final diagnoses:  Anaphylactic reaction due to food   Discharge Instructions   None    ED Prescriptions   None    PDMP not reviewed this encounter.   Elson Areas, [place] 05/29/23 1727

## 2023-05-29 NOTE — ED Notes (Signed)
 Patient in the care of [facility] and in transport.

## 2023-05-29 NOTE — ED Triage Notes (Signed)
 Allergic reaction to crab started about 11:00 am throat feels normal per family member.  Patient is red in general and itching.    Took an allergy medicine.    Patient delivered a baby one week ago.  Patient is breast feeding.    Denies pain.  Tongue is not swelling, lips are swollen

## 2023-05-29 NOTE — ED Notes (Signed)
 Called and placed PT on monitor with C[facility].

## 2023-05-31 MED FILL — Ipratropium-Albuterol Nebu Soln 0.5-2.5(3) MG/3ML: RESPIRATORY_TRACT | Qty: 3 | Status: AC

## 2023-05-31 MED FILL — Albuterol Sulfate Soln Nebu 0.083% (2.5 MG/3ML): RESPIRATORY_TRACT | Qty: 3 | Status: AC

## 2023-06-01 ENCOUNTER — Telehealth (HOSPITAL_COMMUNITY): Payer: Self-pay | Admitting: *Deleted

## 2023-06-01 NOTE — Telephone Encounter (Signed)
 06/01/2023  Name: Crystal Griffith MRN: [id number] DOB: 1996-02-20  Reason for Call:  Transition of Care Hospital Discharge Call  Contact Status: Patient Contact Status: Complete  Language assistant needed: Interpreter Mode: Telephonic Interpreter Interpreter Name: Johnney Ou [phone] Interpreter Phone Number - If applicable: Language [place] [phone]        Follow-Up Questions: Do You Have Any Concerns About Your Health As You Heal From Delivery?: No Do You Have Any Concerns About Your Infants Health?: Yes What Concerns Do You Have About Your Baby?: Patient stated through the interpreter, "He has a mark on his hip, and I want to know if it will ever go away." With additional questioning, patient reported, "The doctor said it is a blood clot. They've even done an ultrasound." Patient stated that infant has a follow-up appointment with doctor on 4/15. RN instructed patient to review her concerns with the doctor at that time. No other questions or concerns voiced at this time.  Edinburgh Postnatal Depression Scale:  In the Past 7 Days:   Patient declined EPDS. Stated, "I don't think it's necessary. I am doing well." PHQ2-9 Depression Scale:     Discharge Follow-up: Edinburgh score requires follow up?: N/A  Post-discharge interventions: Reviewed Newborn Safe Sleep Practices  Signature Deforest Hoyles, RN, 06/01/23, [phone]

## 2023-07-20 ENCOUNTER — Ambulatory Visit (HOSPITAL_COMMUNITY)
Admission: EM | Admit: 2023-07-20 | Discharge: 2023-07-20 | Disposition: A | Discharge status: Home / Self Care | Attending: Emergency Medicine | Admitting: Emergency Medicine

## 2023-07-20 ENCOUNTER — Encounter (HOSPITAL_COMMUNITY): Payer: Self-pay

## 2023-07-20 DIAGNOSIS — J029 Acute pharyngitis, unspecified: Secondary | ICD-10-CM | POA: Diagnosis present

## 2023-07-20 LAB — POCT RAPID STREP A (OFFICE): Rapid Strep A Screen: NEGATIVE

## 2023-07-20 MED ORDER — AMOXICILLIN 500 MG PO CAPS: 500.0000 mg | ORAL_CAPSULE | Freq: Two times a day (BID) | ORAL | 0 refills | Status: AC

## 2023-07-20 NOTE — ED Provider Notes (Signed)
 [facility]    CSN: [id number] Arrival date & time: 07/20/23  1754      History   Chief Complaint Chief Complaint  Patient presents with   Sore Throat    HPI Crystal Griffith is a 27 y.o. female.   Patient presents with sore throat, headache, fever, body aches, and chills x 2 days.  Patient denies cough, congestion, shortness of breath, chest pain, nausea, vomiting, diarrhea, abdominal pain.  Patient reports that he has been taking ibuprofen  and applying lidocaine  patches with no relief.  The history is provided by the patient and medical records.  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 Authorizing Provider  amoxicillin (AMOXIL) 500 MG capsule Take 1 capsule (500 mg total) by mouth 2 (two) times daily for 10 days. 07/20/23 07/30/23 Yes Rosevelt Constable, Gunnar Hereford A, NP  benzocaine -Menthol  (DERMOPLAST) 20-0.5 % AERO Apply 1 Application topically as needed for irritation (perineal discomfort). 05/22/23   Stinson, Jacob J, DO  EPINEPHrine  (EPIPEN  2-PAK) 0.3 mg/0.3 mL IJ SOAJ injection Inject 0.3 mg into the muscle as needed for anaphylaxis. 05/29/23   Lorain Robson, MD  ibuprofen  (ADVIL ) 600 MG tablet Take 1 tablet (600 mg total) by mouth every 6 (six) hours. 05/22/23   Stinson, Jacob J, DO    Family History Family History   Problem Relation Age of Onset   Healthy Mother    Healthy Father     Social History Social History   Tobacco Use   Smoking status: Never   Smokeless tobacco: Never  Vaping Use   Vaping status: Never Used  Substance Use Topics   Alcohol use: Never   Drug use: Never     Allergies   Patient has no known allergies.   Review of Systems Review of Systems  Per HPI  Physical Exam Triage Vital Signs ED Triage Vitals [07/20/23 1847]  Encounter Vitals Group     BP 111/75     Systolic BP Percentile      Diastolic BP Percentile      Pulse Rate 100     Resp 18     Temp 99.8 F (37.7 C)     Temp Source Oral     SpO2 96 %     Weight      Height      Head Circumference      Peak Flow      Pain Score 5     Pain Loc      Pain Education  Exclude from Growth Chart    No data found.  Updated Vital Signs BP 111/75 (BP Location: Right Arm)   Pulse 100   Temp 99.8 F (37.7 C) (Oral)   Resp 18   LMP 07/19/2022   SpO2 96%   Breastfeeding Yes   Visual Acuity Right Eye Distance:   Left Eye Distance:   Bilateral Distance:    Right Eye Near:   Left Eye Near:    Bilateral Near:     Physical Exam Vitals and nursing note reviewed.  Constitutional:      General: She is awake. She is not in acute distress.    Appearance: Normal appearance. She is well-developed and well-groomed. She is not ill-appearing.  HENT:     Right Ear: Tympanic membrane, ear canal and external ear normal.     Left Ear: Tympanic membrane, ear canal and external ear normal.     Nose: Nose normal.     Mouth/Throat:     Mouth: Mucous membranes are moist.     Pharynx: Posterior oropharyngeal erythema present.     Tonsils: Tonsillar exudate present. 2+ on the right. 2+ on the left.  Cardiovascular:     Rate and Rhythm: Normal rate and regular rhythm.  Pulmonary:     Effort: Pulmonary effort is normal.     Breath sounds: Normal breath sounds.  Skin:    General: Skin is warm and dry.   Neurological:     Mental Status: She is alert.  Psychiatric:        Behavior: Behavior is cooperative.      UC Treatments / Results  Labs (all labs ordered are listed, but only abnormal results are displayed) Labs Reviewed  CULTURE, GROUP A STREP [facility])  POCT RAPID STREP A (OFFICE)    EKG   Radiology No results found.  Procedures Procedures (including critical care time)  Medications Ordered in UC Medications - No data to display  Initial Impression / Assessment and Plan / UC Course  I have reviewed the triage vital signs and the nursing notes.  Pertinent labs & imaging results that were available during my care of the patient were reviewed by me and considered in my medical decision making (see chart for details).     Patient is well-appearing.  Vitals are stable.  Upon assessment there is erythema noted to posterior oropharynx.  There is also bilateral tonsillar exudate and 2+ tonsillar swelling noted.  Rapid strep is negative, will send culture to confirm.  Empirically treating for strep pharyngitis with amoxicillin.  Recommended over-the-counter medication as needed for pain and fever.  Discussed return precautions. Final Clinical Impressions(s) / UC Diagnoses   Final diagnoses:  Sore throat  Pharyngitis, unspecified etiology     Discharge Instructions      Start taking amoxicillin twice daily for 10 days for possible strep throat. Your test in clinic today was negative, but we will send a culture to confirm.  You will receive a phone call adjusting the treatment plan if needed. Otherwise continue to alternate between 650 mg of Tylenol  and 400 mg of ibuprofen  every 6-8 hours as needed for sore throat, headache, and any fever. Make sure you are staying hydrated and getting plenty of rest. Return here if your symptoms persist or worsen.  ED Prescriptions     Medication Sig Dispense Auth. Provider   amoxicillin (AMOXIL) 500 MG capsule Take 1 capsule (500  mg total) by mouth 2 (two) times daily for 10 days. 20 capsule  Karon Packer, NP      PDMP not reviewed this encounter.   Levora Reas A, NP 07/20/23 [phone]

## 2023-07-20 NOTE — ED Triage Notes (Signed)
 Pt c/o sore throat and headache x2 days. States took ibuprofen  and using lidocaine  patches with no relief.

## 2023-07-20 NOTE — Discharge Instructions (Addendum)
 Start taking amoxicillin twice daily for 10 days for possible strep throat. Your test in clinic today was negative, but we will send a culture to confirm.  You will receive a phone call adjusting the treatment plan if needed. Otherwise continue to alternate between 650 mg of Tylenol  and 400 mg of ibuprofen  every 6-8 hours as needed for sore throat, headache, and any fever. Make sure you are staying hydrated and getting plenty of rest. Return here if your symptoms persist or worsen.

## 2023-07-23 LAB — CULTURE, GROUP A STREP (THRC)

## 2023-07-24 ENCOUNTER — Ambulatory Visit (HOSPITAL_COMMUNITY): Payer: Self-pay

## 2023-10-17 ENCOUNTER — Other Ambulatory Visit (HOSPITAL_COMMUNITY): Payer: Self-pay

## 2024-02-15 ENCOUNTER — Encounter (HOSPITAL_COMMUNITY): Payer: Self-pay | Admitting: Emergency Medicine

## 2024-02-15 ENCOUNTER — Emergency Department (HOSPITAL_COMMUNITY)
Admission: EM | Admit: 2024-02-15 | Discharge: 2024-02-15 | Disposition: A | Discharge status: Home / Self Care | Attending: Emergency Medicine | Admitting: Emergency Medicine

## 2024-02-15 ENCOUNTER — Other Ambulatory Visit: Payer: Self-pay

## 2024-02-15 DIAGNOSIS — J101 Influenza due to other identified influenza virus with other respiratory manifestations: Secondary | ICD-10-CM | POA: Insufficient documentation

## 2024-02-15 DIAGNOSIS — R509 Fever, unspecified: Secondary | ICD-10-CM | POA: Diagnosis present

## 2024-02-15 LAB — RESP PANEL BY RT-PCR (RSV, FLU A&B, COVID)  RVPGX2
Influenza A by PCR: POSITIVE — AB
Influenza B by PCR: NEGATIVE
Resp Syncytial Virus by PCR: NEGATIVE
SARS Coronavirus 2 by RT PCR: NEGATIVE

## 2024-02-15 MED ORDER — ACETAMINOPHEN 500 MG PO TABS
1000.0000 mg | ORAL_TABLET | Freq: Once | ORAL | Status: AC
  Administered 2024-02-15: 1000 mg via ORAL
  Filled 2024-02-15: qty 2

## 2024-02-15 NOTE — ED Triage Notes (Signed)
 Interpreter service using during triage Crystal Griffith [phone].

## 2024-02-15 NOTE — ED Triage Notes (Signed)
 Patient reports fever with sore throat and occasional productive cough onset yesterday .

## 2024-02-15 NOTE — ED Provider Notes (Addendum)
 " [place] [facility] Provider Note   CSN: [id number] Arrival date & time: 02/15/24  2025     Patient presents with: Fever , Cough , Sore Throat   Crystal Griffith is a 28 y.o. female.   28 yo female with cough, fevers, body aches. Onset 2 days ago, exposed to son who was sick. No other health concerns.       Prior to Admission medications  Medication Sig Start Date End Date Taking? Authorizing Provider  benzocaine -Menthol  (DERMOPLAST) 20-0.5 % AERO Apply 1 Application topically as needed for irritation (perineal discomfort). 05/22/23   Stinson, Jacob J, DO  EPINEPHrine  (EPIPEN  2-PAK) 0.3 mg/0.3 mL IJ SOAJ injection Inject 0.3 mg into the muscle as needed for anaphylaxis. 05/29/23   Edris Palma, MD  ibuprofen  (ADVIL ) 600 MG tablet Take 1 tablet (600 mg total) by mouth every 6 (six) hours. 05/22/23   Stinson, Jacob J, DO    Allergies: Patient has no known allergies.    Review of Systems Negative except as per HPI Updated Vital Signs BP 110/69 (BP Location: Right Arm)   Pulse 94   Temp 99 F (37.2 C) (Oral)   Resp 16   Ht 5' 1 (1.549 m)   Wt 84 kg   SpO2 99%   BMI 34.99 kg/m   Physical Exam Vitals and nursing note reviewed.  Constitutional:      General: She is not in acute distress.    Appearance: She is well-developed. She is not diaphoretic.  HENT:     Head: Normocephalic and atraumatic.     Right Ear: Tympanic membrane and ear canal normal.     Left Ear: Tympanic membrane and ear canal normal.     Nose: Congestion present.     Mouth/Throat:     Mouth: Mucous membranes are moist.  Eyes:     Conjunctiva/sclera: Conjunctivae normal.  Cardiovascular:     Rate and Rhythm: Normal rate and regular rhythm.     Heart sounds: Normal heart sounds.  Pulmonary:     Effort: Pulmonary effort is normal.  Musculoskeletal:     Cervical back: Neck supple.  Lymphadenopathy:     Cervical: No cervical adenopathy.  Skin:    General: Skin is  warm and dry.     Findings: No erythema or rash.  Neurological:     Mental Status: She is alert and oriented to person, place, and time.  Psychiatric:        Behavior: Behavior normal.     (all labs ordered are listed, but only abnormal results are displayed) Labs Reviewed  RESP PANEL BY RT-PCR (RSV, FLU A&B, COVID)  RVPGX2 - Abnormal; Notable for the following components:      Result Value   Influenza A by PCR POSITIVE (*)    All other components within normal limits    EKG: None  Radiology: No results found.   Procedures   Medications Ordered in the ED  acetaminophen  (TYLENOL ) tablet 1,000 mg (1,000 mg Oral Given 02/15/24 2044)                                    Medical Decision Making Risk OTC drugs.   28 year old female presents with 2 days of cough, fevers, body aches.  Exposed to son who has also been sick.  Patient is positive for flu, negative for COVID and RSV.  Nontoxic and in  no distress on exam. Recommend symptomatic treatment at home with motrin /tylenol , fluids. Return to ER as needed.      Final diagnoses:  Influenza A    ED Discharge Orders     None          Beverley Leita LABOR, PA-C 02/16/24 0329    Beverley Leita LABOR, PA-C 02/17/24 AMPARO Jerral Meth, MD 02/17/24 [phone]  "

## 2024-02-15 NOTE — Discharge Instructions (Signed)
 Motrin  600mg  every 6-8 hours as needed. Tylenol  650mg  every 4-6 hours as needed.  Home to rest and hydrate.

## 2024-02-18 ENCOUNTER — Ambulatory Visit: Admission: EM | Admit: 2024-02-18 | Discharge: 2024-02-18 | Disposition: A | Discharge status: Home / Self Care

## 2024-02-18 DIAGNOSIS — J111 Influenza due to unidentified influenza virus with other respiratory manifestations: Secondary | ICD-10-CM

## 2024-02-18 MED ORDER — BENZONATATE 100 MG PO CAPS: 100.0000 mg | ORAL_CAPSULE | Freq: Three times a day (TID) | ORAL | 0 refills | Status: DC

## 2024-02-18 MED ORDER — PSEUDOEPHEDRINE HCL 30 MG PO TABS: 30.0000 mg | ORAL_TABLET | ORAL | 0 refills | Status: AC | PRN

## 2024-02-18 MED ORDER — GUAIFENESIN ER 600 MG PO TB12: 600.0000 mg | ORAL_TABLET | Freq: Two times a day (BID) | ORAL | 0 refills | Status: AC

## 2024-02-18 MED ORDER — OSELTAMIVIR PHOSPHATE 75 MG PO CAPS: 75.0000 mg | ORAL_CAPSULE | Freq: Two times a day (BID) | ORAL | 0 refills | Status: AC

## 2024-02-18 MED ORDER — DELSYM 15 MG PO TABS: ORAL_TABLET | ORAL | 0 refills | Status: AC

## 2024-02-18 NOTE — ED Provider Notes (Signed)
 " [facility]    CSN: [id number] Arrival date & time: 02/18/24  0913      History   Chief Complaint No chief complaint on file.   HPI Crystal Griffith is a 28 y.o. female.   Pt that is currently breastfeeding presents today after being diagnosed with flu A on Thursday in the ER. Pt states that she is still experiencing throat pain, cough, and nasal congestion. Pt states that she is on taking Tylenol  during the day and using Nyquil at night.   The history is provided by the patient.    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 Authorizing Provider  Dextromethorphan  HBr (DELSYM ) 15 MG TABS 30 mg po BID as needed for cough. 02/18/24  Yes Andra Krabbe C, PA-C  guaiFENesin  (MUCINEX ) 600 MG 12 hr tablet Take 1 tablet (600 mg total) by mouth 2 (two) times daily for 10 days. 02/18/24 02/28/24 Yes Andra Krabbe BROCKS, PA-C  oseltamivir  (TAMIFLU ) 75 MG capsule Take 1 capsule (75 mg total) by mouth every 12 (twelve) hours. 02/18/24  Yes Andra Krabbe BROCKS, PA-C  pseudoephedrine  (SUDAFED) 30 MG tablet Take 1 tablet (30 mg total) by mouth every 4 (four) hours as needed for congestion. 02/18/24  Yes Andra Krabbe C, PA-C  benzocaine -Menthol  (DERMOPLAST) 20-0.5 % AERO Apply 1 Application topically as needed for irritation (perineal  discomfort). 05/22/23   Stinson, Jacob J, DO  EPINEPHrine  (EPIPEN  2-PAK) 0.3 mg/0.3 mL IJ SOAJ injection Inject 0.3 mg into the muscle as needed for anaphylaxis. 05/29/23   Edris Palma, MD  ibuprofen  (ADVIL ) 600 MG tablet Take 1 tablet (600 mg total) by mouth every 6 (six) hours. 05/22/23   Stinson, Jacob J, DO    Family History Family History  Problem Relation Age of Onset   Healthy Mother    Healthy Father     Social History Social History[1]   Allergies   Patient has no known allergies.   Review of Systems Review of Systems   Physical Exam Triage Vital Signs ED Triage Vitals  Encounter Vitals Group     BP 02/18/24 1051 102/70     Girls Systolic BP Percentile --      Girls Diastolic BP Percentile --      Boys Systolic BP Percentile --      Boys Diastolic BP Percentile --      Pulse Rate 02/18/24 1051 85     Resp 02/18/24 1051 16     Temp 02/18/24 1051 98.7 F (37.1 C)     Temp Source  02/18/24 1051 Oral     SpO2 02/18/24 1051 99 %     Weight 02/18/24 1049 163 lb (73.9 kg)     Height 02/18/24 1049 5' (1.524 m)     Head Circumference --      Peak Flow --      Pain Score 02/18/24 1049 2     Pain Loc --      Pain Education --      Exclude from Growth Chart --    No data found.  Updated Vital Signs BP 102/70 (BP Location: Left Arm)   Pulse 85   Temp 98.7 F (37.1 C) (Oral)   Resp 16   Ht 5' (1.524 m)   Wt 163 lb (73.9 kg)   SpO2 99%   Breastfeeding Yes   BMI 31.83 kg/m   Visual Acuity Right Eye Distance:   Left Eye Distance:   Bilateral Distance:    Right Eye Near:   Left Eye Near:    Bilateral Near:     Physical Exam Vitals and nursing note reviewed.  Constitutional:      General: She is not in acute distress.    Appearance: Normal appearance. She is not ill-appearing, toxic-appearing or diaphoretic.  HENT:     Nose: Congestion (moderately enlarged turbinates) present. No rhinorrhea.     Mouth/Throat:     Mouth: Mucous membranes are moist.      Pharynx: Oropharynx is clear. No oropharyngeal exudate or posterior oropharyngeal erythema.  Eyes:     General: No scleral icterus. Cardiovascular:     Rate and Rhythm: Normal rate and regular rhythm.     Heart sounds: Normal heart sounds.  Pulmonary:     Effort: Pulmonary effort is normal. No respiratory distress.     Breath sounds: Normal breath sounds. No wheezing or rhonchi.  Skin:    General: Skin is warm.  Neurological:     Mental Status: She is alert and oriented to person, place, and time.  Psychiatric:        Mood and Affect: Mood normal.        Behavior: Behavior normal.      UC Treatments / Results  Labs (all labs ordered are listed, but only abnormal results are displayed) Labs Reviewed - No data to display  EKG   Radiology No results found.  Procedures Procedures (including critical care time)  Medications Ordered in UC Medications - No data to display  Initial Impression / Assessment and Plan / UC Course  I have reviewed the triage vital signs and the nursing notes.  Pertinent labs & imaging results that were available during my care of the patient were reviewed by me and considered in my medical decision making (see chart for details).     Final Clinical Impressions(s) / UC Diagnoses   Final diagnoses:  Influenza     Discharge Instructions      You been diagnosed with a viral illness today. -Viruses have to run their course and medicines that are prescribed are meant to help with symptoms. - With viruses usually feel poorly from 3 to 7 days with cough being the last symptoms to resolve.  -Cough can linger from days to weeks.  Antibiotics are not effective for viruses. -If your cough lasts more than 2 weeks and you are coughing so hard that you are vomiting or feel like you could pass out we need to follow-up with PCP for further testing and evaluation. -Rest, increase water intake, may use  pseudoephedrine  for nasal congestion, Delsym   (dextromethorphan ) or honey as needed for cough, and ibuprofen  and/or Tylenol  as directed on packaging for pain and fever. -If you have hypertension you should take Coricidin or other OTC meds approved for people with high blood pressure. -You may use a spoonful of honey every 4-6 hours as needed for throat pain and cough. -Warm tea with honey and lemon are helpful for soothe throat as well.  Chloraseptic and Cepacol make a throat lozenge with numbing medication, can be purchased over-the-counter. -May also use Flonase or sinus rinse for sinus pressure or nasal congestion.  Be sure to use distilled bottled water for sinus rinses. -May use coolmist humidifier to open up nasal passages -May elevate head to assist with postnasal drainage. -If you feel poorly (fever, fatigue, shortness of breath, nausea, etc.) for more than 10 days to be sure to follow-up with PCP or in clinic for further evaluation and additional treatments. If you experience chest pain with shortness of breath or pulse oxygen less than 95% you should report to the ER.    ED Prescriptions     Medication Sig Dispense Auth. Provider   benzonatate  (TESSALON ) 100 MG capsule  (Status: Discontinued) Take 1 capsule (100 mg total) by mouth every 8 (eight) hours. 30 capsule Andra Krabbe C, PA-C   guaiFENesin  (MUCINEX ) 600 MG 12 hr tablet Take 1 tablet (600 mg total) by mouth 2 (two) times daily for 10 days. 20 tablet Andra Krabbe BROCKS, PA-C   pseudoephedrine  (SUDAFED) 30 MG tablet Take 1 tablet (30 mg total) by mouth every 4 (four) hours as needed for congestion. 30 tablet Andra Krabbe C, PA-C   Dextromethorphan  HBr (DELSYM ) 15 MG TABS 30 mg po BID as needed for cough. 20 tablet Andra Krabbe C, PA-C   oseltamivir  (TAMIFLU ) 75 MG capsule Take 1 capsule (75 mg total) by mouth every 12 (twelve) hours. 10 capsule Andra Krabbe BROCKS, PA-C      PDMP not reviewed this encounter.    [1]  Social History Tobacco Use    Smoking status: Never   Smokeless tobacco: Never  Vaping Use   Vaping status: Never Used  Substance Use Topics   Alcohol use: Never   Drug use: Never     Andra Krabbe BROCKS, PA-C 02/18/24 1146  "

## 2024-02-18 NOTE — ED Triage Notes (Signed)
 Valory presents with cough, fever, sore throat, coldness and fever. Cold and fever is gone. Symptoms started last Wednesday. Sore throat is still present. She went to ED on Thursday night, tested positive for Flu. Treatment with Day & Night Nightquil and Tylenol  without relief.

## 2024-02-18 NOTE — Discharge Instructions (Addendum)
